# Patient Record
Sex: Female | Born: 1970 | Race: Black or African American | Hispanic: No | Marital: Married | Smoking: Never smoker
Health system: Southern US, Community
[De-identification: ages and names within clinical notes are randomized; demographics above are authoritative.]

## PROBLEM LIST (undated history)

## (undated) DIAGNOSIS — M5416 Radiculopathy, lumbar region: Secondary | ICD-10-CM

## (undated) DIAGNOSIS — Z91148 Patient's other noncompliance with medication regimen for other reason: Secondary | ICD-10-CM

## (undated) DIAGNOSIS — I10 Essential (primary) hypertension: Secondary | ICD-10-CM

## (undated) DIAGNOSIS — D649 Anemia, unspecified: Secondary | ICD-10-CM

## (undated) DIAGNOSIS — Z9114 Patient's other noncompliance with medication regimen: Secondary | ICD-10-CM

## (undated) DIAGNOSIS — M549 Dorsalgia, unspecified: Secondary | ICD-10-CM

## (undated) HISTORY — PX: BREAST SURGERY: SHX581

## (undated) HISTORY — PX: TUBAL LIGATION: SHX77

## (undated) HISTORY — PX: BACK SURGERY: SHX140

---

## 2001-02-09 ENCOUNTER — Other Ambulatory Visit: Admission: RE | Admit: 2001-02-09 | Discharge: 2001-02-09 | Payer: Self-pay | Admitting: Internal Medicine

## 2001-05-28 ENCOUNTER — Encounter: Payer: Self-pay | Admitting: Emergency Medicine

## 2001-05-28 ENCOUNTER — Emergency Department (HOSPITAL_COMMUNITY): Admission: EM | Admit: 2001-05-28 | Discharge: 2001-05-28 | Payer: Self-pay | Admitting: Emergency Medicine

## 2003-06-19 ENCOUNTER — Emergency Department (HOSPITAL_COMMUNITY): Admission: EM | Admit: 2003-06-19 | Discharge: 2003-06-20 | Payer: Self-pay | Admitting: Emergency Medicine

## 2003-09-12 ENCOUNTER — Ambulatory Visit (HOSPITAL_COMMUNITY): Admission: RE | Admit: 2003-09-12 | Discharge: 2003-09-12 | Payer: Self-pay | Admitting: Family Medicine

## 2004-03-08 ENCOUNTER — Emergency Department (HOSPITAL_COMMUNITY): Admission: EM | Admit: 2004-03-08 | Discharge: 2004-03-08 | Payer: Self-pay | Admitting: Emergency Medicine

## 2004-03-17 ENCOUNTER — Emergency Department (HOSPITAL_COMMUNITY): Admission: EM | Admit: 2004-03-17 | Discharge: 2004-03-17 | Payer: Self-pay | Admitting: Emergency Medicine

## 2004-04-05 ENCOUNTER — Emergency Department (HOSPITAL_COMMUNITY): Admission: EM | Admit: 2004-04-05 | Discharge: 2004-04-05 | Payer: Self-pay | Admitting: Emergency Medicine

## 2004-07-18 ENCOUNTER — Emergency Department (HOSPITAL_COMMUNITY): Admission: EM | Admit: 2004-07-18 | Discharge: 2004-07-18 | Payer: Self-pay | Admitting: Emergency Medicine

## 2006-06-09 ENCOUNTER — Emergency Department (HOSPITAL_COMMUNITY): Admission: EM | Admit: 2006-06-09 | Discharge: 2006-06-09 | Payer: Self-pay | Admitting: Emergency Medicine

## 2006-12-07 ENCOUNTER — Emergency Department (HOSPITAL_COMMUNITY): Admission: EM | Admit: 2006-12-07 | Discharge: 2006-12-07 | Payer: Self-pay | Admitting: Emergency Medicine

## 2007-01-20 ENCOUNTER — Emergency Department (HOSPITAL_COMMUNITY): Admission: EM | Admit: 2007-01-20 | Discharge: 2007-01-20 | Payer: Self-pay | Admitting: Emergency Medicine

## 2007-04-04 ENCOUNTER — Emergency Department (HOSPITAL_COMMUNITY): Admission: EM | Admit: 2007-04-04 | Discharge: 2007-04-04 | Payer: Self-pay | Admitting: Emergency Medicine

## 2008-10-26 ENCOUNTER — Emergency Department (HOSPITAL_COMMUNITY): Admission: EM | Admit: 2008-10-26 | Discharge: 2008-10-26 | Payer: Self-pay | Admitting: Emergency Medicine

## 2008-12-04 ENCOUNTER — Emergency Department (HOSPITAL_COMMUNITY): Admission: EM | Admit: 2008-12-04 | Discharge: 2008-12-04 | Payer: Self-pay | Admitting: Emergency Medicine

## 2008-12-12 ENCOUNTER — Emergency Department (HOSPITAL_COMMUNITY): Admission: EM | Admit: 2008-12-12 | Discharge: 2008-12-12 | Payer: Self-pay | Admitting: Emergency Medicine

## 2008-12-13 ENCOUNTER — Emergency Department (HOSPITAL_COMMUNITY): Admission: EM | Admit: 2008-12-13 | Discharge: 2008-12-13 | Payer: Self-pay | Admitting: Emergency Medicine

## 2009-01-03 ENCOUNTER — Encounter
Admission: RE | Admit: 2009-01-03 | Discharge: 2009-01-08 | Payer: Self-pay | Admitting: Physical Medicine & Rehabilitation

## 2009-01-08 ENCOUNTER — Ambulatory Visit: Payer: Self-pay | Admitting: Physical Medicine & Rehabilitation

## 2009-02-25 ENCOUNTER — Emergency Department (HOSPITAL_COMMUNITY): Admission: EM | Admit: 2009-02-25 | Discharge: 2009-02-25 | Payer: Self-pay | Admitting: Emergency Medicine

## 2010-09-08 ENCOUNTER — Encounter: Payer: Self-pay | Admitting: Family Medicine

## 2010-12-18 ENCOUNTER — Emergency Department (HOSPITAL_COMMUNITY)
Admission: EM | Admit: 2010-12-18 | Discharge: 2010-12-18 | Disposition: A | Discharge status: Home / Self Care | Payer: Medicaid Other | Attending: Emergency Medicine | Admitting: Emergency Medicine

## 2010-12-18 ENCOUNTER — Emergency Department (HOSPITAL_COMMUNITY): Payer: Medicaid Other

## 2010-12-18 DIAGNOSIS — M545 Low back pain, unspecified: Secondary | ICD-10-CM | POA: Insufficient documentation

## 2010-12-18 DIAGNOSIS — J309 Allergic rhinitis, unspecified: Secondary | ICD-10-CM | POA: Insufficient documentation

## 2010-12-18 DIAGNOSIS — W010XXA Fall on same level from slipping, tripping and stumbling without subsequent striking against object, initial encounter: Secondary | ICD-10-CM | POA: Insufficient documentation

## 2010-12-18 DIAGNOSIS — I10 Essential (primary) hypertension: Secondary | ICD-10-CM | POA: Insufficient documentation

## 2010-12-18 DIAGNOSIS — S93409A Sprain of unspecified ligament of unspecified ankle, initial encounter: Secondary | ICD-10-CM | POA: Insufficient documentation

## 2010-12-18 DIAGNOSIS — S335XXA Sprain of ligaments of lumbar spine, initial encounter: Secondary | ICD-10-CM | POA: Insufficient documentation
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
## 2011-01-27 ENCOUNTER — Emergency Department (HOSPITAL_COMMUNITY)
Admission: EM | Admit: 2011-01-27 | Discharge: 2011-01-27 | Disposition: A | Discharge status: Home / Self Care | Payer: Medicaid Other | Attending: Emergency Medicine | Admitting: Emergency Medicine

## 2011-01-27 DIAGNOSIS — IMO0002 Reserved for concepts with insufficient information to code with codable children: Secondary | ICD-10-CM | POA: Insufficient documentation

## 2011-01-27 DIAGNOSIS — M549 Dorsalgia, unspecified: Secondary | ICD-10-CM | POA: Insufficient documentation

## 2011-01-27 DIAGNOSIS — X58XXXA Exposure to other specified factors, initial encounter: Secondary | ICD-10-CM | POA: Insufficient documentation

## 2011-01-27 DIAGNOSIS — I10 Essential (primary) hypertension: Secondary | ICD-10-CM | POA: Insufficient documentation

## 2011-03-16 ENCOUNTER — Emergency Department (HOSPITAL_COMMUNITY)
Admission: EM | Admit: 2011-03-16 | Discharge: 2011-03-16 | Disposition: A | Discharge status: Home / Self Care | Payer: Medicaid Other | Attending: Emergency Medicine | Admitting: Emergency Medicine

## 2011-03-16 DIAGNOSIS — M79609 Pain in unspecified limb: Secondary | ICD-10-CM | POA: Insufficient documentation

## 2011-03-16 DIAGNOSIS — M549 Dorsalgia, unspecified: Secondary | ICD-10-CM | POA: Insufficient documentation

## 2011-03-16 DIAGNOSIS — M62838 Other muscle spasm: Secondary | ICD-10-CM

## 2011-03-16 HISTORY — DX: Essential (primary) hypertension: I10

## 2011-03-16 LAB — BASIC METABOLIC PANEL
BUN: 7 mg/dL (ref 6–23)
CO2: 22 mEq/L (ref 19–32)
Chloride: 100 mEq/L (ref 96–112)
Creatinine, Ser: 0.83 mg/dL (ref 0.50–1.10)
GFR calc Af Amer: >60 mL/min (ref >60)
Potassium: 3.5 mEq/L (ref 3.5–5.1)

## 2011-03-16 LAB — MAGNESIUM: Magnesium: 1.8 mg/dL (ref 1.5–2.5)

## 2011-03-16 MED ORDER — HYDROMORPHONE HCL 2 MG/ML IJ SOLN
2.0000 mg | Freq: Once | INTRAMUSCULAR | Status: AC
  Administered 2011-03-16: 2 mg via INTRAMUSCULAR
  Filled 2011-03-16: qty 1

## 2011-03-16 MED ORDER — ONDANSETRON 4 MG PO TBDP
ORAL_TABLET | ORAL | Status: AC
  Filled 2011-03-16: qty 1

## 2011-03-16 MED ORDER — ONDANSETRON 4 MG PO TBDP
4.0000 mg | ORAL_TABLET | Freq: Once | ORAL | Status: AC
  Administered 2011-03-16: 4 mg via ORAL
  Filled 2011-03-16: qty 1

## 2011-03-16 MED ORDER — DIAZEPAM 5 MG PO TABS
5.0000 mg | ORAL_TABLET | Freq: Once | ORAL | Status: AC
  Administered 2011-03-16: 5 mg via ORAL
  Filled 2011-03-16: qty 1

## 2011-03-16 MED ORDER — CYCLOBENZAPRINE HCL 10 MG PO TABS: 10.0000 mg | ORAL_TABLET | Freq: Two times a day (BID) | ORAL | Status: AC | PRN

## 2011-03-16 MED ORDER — NAPROXEN 500 MG PO TABS: 500.0000 mg | ORAL_TABLET | Freq: Two times a day (BID) | ORAL | Status: DC

## 2011-03-16 MED ORDER — OXYCODONE-ACETAMINOPHEN 5-325 MG PO TABS: 2.0000 | ORAL_TABLET | ORAL | Status: AC | PRN

## 2011-03-16 MED ORDER — NAPROXEN 250 MG PO TABS
500.0000 mg | ORAL_TABLET | Freq: Once | ORAL | Status: AC
  Administered 2011-03-16: 500 mg via ORAL
  Filled 2011-03-16: qty 2
  Filled 2011-03-16: qty 1
  Filled 2011-03-16: qty 2

## 2011-03-16 NOTE — ED Notes (Signed)
Pt still rates pain as a 6, pt is sleeping, will arouse with verbal stimuli,

## 2011-03-16 NOTE — ED Notes (Signed)
Pt c/o cramping to right upper leg upon standing up at discharge, heat bags applied to right leg per pt request, states that the heat makes the cramp better, pt assisted to wheelchair, family member at bedside,

## 2011-03-16 NOTE — ED Notes (Signed)
Pt states that the heat is making the cramp better,

## 2011-03-16 NOTE — ED Notes (Signed)
Pt up walking in room,

## 2011-03-16 NOTE — ED Notes (Signed)
Tried to walk pt, pt said she was in to much pain, she was having another cramp, let the RN know

## 2011-03-16 NOTE — ED Notes (Signed)
Pt reports woke up this am with severe muscle cramps in both legs.  Says has had this happen before but say doesn't know what caused it.

## 2011-03-16 NOTE — ED Provider Notes (Signed)
Scribed for Dr. Brooke Dare, the patient was seen in room 1. This chart was scribed by Jannette Fogo. This patient's care was started at 10:26.   Chief Complaint  Patient presents with  . Leg Pain    muscle cramps    HPI Mariah Holloway is a 40 y.o. female with a history of lumbar post laminectomy syndrome with chronic radicular pain, left S1 dermatome, who presents to the Emergency Department complaining of bilateral lower extremity muscle cramping/spasms and chronic back pain. Patient states she developed the muscle cramps/spasms on her thighs which radiated to the back and migrated down the legs. Pain is exacerbated by movement and today while trying to ambulate she had difficulty due to pain then developed dizziness. Currently patient complains of ankle spasms. She denies any associated bowel/urinary incontinence or problems urinating. She reports a history of similar symptoms in the past but not this severe. Has taken Hydrocodone but reports no relief. There are no other associated symptoms and no other alleviating or aggravating factors.     Past Medical History  Diagnosis Date  . Hypertension   Lumbar post laminectomy syndrome with chronic radicular pain, left S1 dermatome.   Past Surgical History  Procedure Date  . Back surgery   . Breast surgery      MEDICATIONS:  Previous Medications   AMLODIPINE-BENAZEPRIL (LOTREL) 5-20 MG PER CAPSULE    Take 1 capsule by mouth every morning.     CLONAZEPAM (KLONOPIN) 1 MG TABLET    Take 1 mg by mouth 3 (three) times daily.     ESCITALOPRAM (LEXAPRO) 20 MG TABLET    Take 20 mg by mouth every morning.     NIACIN 500 MG TABLET    Take 500 mg by mouth daily.     OMEPRAZOLE (PRILOSEC) 20 MG CAPSULE    Take 20 mg by mouth every morning.     TRAZODONE (DESYREL) 50 MG TABLET    Take 50 mg by mouth at bedtime.       ALLERGIES:  Allergies as of 03/16/2011 - Review Complete 03/16/2011  Allergen Reaction Noted  . Sulfa antibiotics Hives and Swelling  03/16/2011     Family History  Problem Relation Age of Onset  . Diabetes Mother   . Hypertension Father     History  Substance Use Topics  . Smoking status: Never Smoker   . Smokeless tobacco: Not on file  . Alcohol Use: No  Accompanied to the ED by family    Review of Systems  Constitutional: Negative.   HENT: Negative.   Respiratory: Negative.   Cardiovascular: Negative.   Gastrointestinal: Negative.   Genitourinary: Negative for dysuria, frequency and difficulty urinating.  Musculoskeletal: Positive for back pain. Negative for joint swelling.  Skin: Negative.   Neurological: Negative.   Hematological: Negative.     Physical Exam  BP 121/94  Pulse 93  Temp 97.6 F (36.4 C)  Resp 18  Ht 5\' 4"  (1.626 m)  Wt 250 lb (113.399 kg)  BMI 42.91 kg/m2  SpO2 98%  LMP 03/02/2011   Physical Exam  Constitutional: She is oriented to person, place, and time. She appears well-developed and well-nourished.       Tearful. Uncomfortable appearing.   HENT:  Head: Normocephalic and atraumatic.  Mouth/Throat: Oropharynx is clear and moist.  Eyes: Conjunctivae are normal. Pupils are equal, round, and reactive to light.  Neck: Normal range of motion. Neck supple.  Cardiovascular: Normal rate, regular rhythm and normal heart sounds.  Pulmonary/Chest: Effort normal and breath sounds normal.  Abdominal: Soft. She exhibits no distension.  Musculoskeletal: Normal range of motion. She exhibits no edema and no tenderness.       Lumbar back: She exhibits tenderness (bilateral lower lateral musculature tenderness. No midline tenderness. ). She exhibits no swelling and no edema.       Palpable spasm in legs.   Neurological: She is alert and oriented to person, place, and time. Coordination normal.  Skin: Skin is warm and dry. No rash noted.    OTHER DATA REVIEWED: Nursing notes, vital signs, and past medical records reviewed.   DIAGNOSTIC STUDIES: Oxygen Saturation is 98% on room  air, normal by my interpretation.     LABS / RADIOLOGY:  Results for orders placed during the hospital encounter of 03/16/11  BASIC METABOLIC PANEL      Component Value Range   Sodium 138  135 - 145 (mEq/L)   Potassium 3.5  3.5 - 5.1 (mEq/L)   Chloride 100  96 - 112 (mEq/L)   CO2 22  19 - 32 (mEq/L)   Glucose, Bld 106 (*) 70 - 99 (mg/dL)   BUN 7  6 - 23 (mg/dL)   Creatinine, Ser 4.09  0.50 - 1.10 (mg/dL)   Calcium 9.1  8.4 - 81.1 (mg/dL)   GFR calc non Af Amer >60  >60 (mL/min)   GFR calc Af Amer >60  >60 (mL/min)  MAGNESIUM      Component Value Range   Magnesium 1.8  1.5 - 2.5 (mg/dL)     ED COURSE / COORDINATION OF CARE: 11:01 - Dilaudid, Valium, and Zofran given.  11:45 - Re-examined by ED physician, patient screams when back is palpated.  12:02 - Re-examined, patient ready for gait test.  12:15 - Patient ambulatory with steady gait while in the ED.  12:50 - Patient stable for discharge.    MDM: Differential Diagnosis: Muscle spasm versus strain His chemistry was performed to rule out partial abnormalities as a cause for cramping. This received Dilaudid, Valium, Naprosyn for symptom control. She had improvement after taking the medications. I have no concern for a malignant cause of back pain such as cauda equina as the patient has no loss of bowel or bladder function, weakness, paresthesias. There was palpable spasm on examination of the back as well as legs. The patient ambulated around the department without difficulty. She'll be discharged home with pain control, muscle relaxers, anti-inflammatories and instructions to followup with her primary care physician in one week. She provided signs and symptoms of which returned emergency dept  I personally performed the services described in this documentation, which was scribed in my presence. The recorded information has been reviewed and considered.    IMPRESSION: Diagnoses that have been ruled out:  Diagnoses that are  still under consideration:  Final diagnoses:  Spasm of muscle     PLAN: Discharged  The patient is to return the emergency department if there is any worsening of symptoms. I have reviewed the discharge instructions with the patient.    CONDITION ON DISCHARGE: Stable     MEDICATIONS GIVEN IN THE E.D.  Medications  traZODone (DESYREL) 50 MG tablet (not administered)  escitalopram (LEXAPRO) 20 MG tablet (not administered)  omeprazole (PRILOSEC) 20 MG capsule (not administered)  amLODipine-benazepril (LOTREL) 5-20 MG per capsule (not administered)  clonazePAM (KLONOPIN) 1 MG tablet (not administered)  niacin 500 MG tablet (not administered)  HYDROmorphone (DILAUDID) injection 2 mg (2 mg Intramuscular Given 03/16/11 1101)  naproxen (NAPROSYN) tablet 500 mg (500 mg Oral Given 03/16/11 1107)  diazepam (VALIUM) tablet 5 mg (5 mg Oral Given 03/16/11 1059)  ondansetron (ZOFRAN-ODT) disintegrating tablet 4 mg (4 mg Oral Given 03/16/11 1158)     DISCHARGE MEDICATIONS: New Prescriptions   No medications on file     Procedures        Dayton Bailiff, MD 03/16/11 1250

## 2011-03-27 ENCOUNTER — Emergency Department (HOSPITAL_COMMUNITY)
Admission: EM | Admit: 2011-03-27 | Discharge: 2011-03-27 | Disposition: A | Discharge status: Home / Self Care | Payer: Medicaid Other | Attending: Emergency Medicine | Admitting: Emergency Medicine

## 2011-03-27 ENCOUNTER — Encounter (HOSPITAL_COMMUNITY): Payer: Self-pay | Admitting: *Deleted

## 2011-03-27 DIAGNOSIS — L03119 Cellulitis of unspecified part of limb: Secondary | ICD-10-CM | POA: Insufficient documentation

## 2011-03-27 DIAGNOSIS — L02416 Cutaneous abscess of left lower limb: Secondary | ICD-10-CM

## 2011-03-27 DIAGNOSIS — L02419 Cutaneous abscess of limb, unspecified: Secondary | ICD-10-CM | POA: Insufficient documentation

## 2011-03-27 DIAGNOSIS — I10 Essential (primary) hypertension: Secondary | ICD-10-CM | POA: Insufficient documentation

## 2011-03-27 MED ORDER — LIDOCAINE-EPINEPHRINE (PF) 2 %-1:200000 IJ SOLN
INTRAMUSCULAR | Status: AC
  Filled 2011-03-27: qty 20

## 2011-03-27 MED ORDER — LIDOCAINE-EPINEPHRINE 2 %-1:100000 IJ SOLN
20.0000 mL | Freq: Once | INTRAMUSCULAR | Status: AC
  Administered 2011-03-27: 20 mL

## 2011-03-27 MED ORDER — DOXYCYCLINE HYCLATE 100 MG PO CAPS: 100.0000 mg | ORAL_CAPSULE | Freq: Two times a day (BID) | ORAL | Status: DC

## 2011-03-27 NOTE — ED Provider Notes (Signed)
History     CSN: [id number] Arrival date & time: 03/27/2011 10:38 AM  Chief Complaint  Patient presents with  . Abscess   Patient is a 40 y.o. female presenting with abscess. The history is provided by the patient.  Abscess  This is a new problem. The current episode started yesterday. The onset was gradual. The problem occurs continuously. The problem has been unchanged. Affected Location: left thigh. The problem is mild. The abscess is characterized by draining. Pertinent negatives include no fever and no vomiting. Her past medical history is significant for skin abscesses in family. There were no sick contacts.    Past Medical History  Diagnosis Date  . Hypertension     Past Surgical History  Procedure Date  . Back surgery   . Breast surgery     Family History  Problem Relation Age of Onset  . Diabetes Mother   . Hypertension Father     History  Substance Use Topics  . Smoking status: Never Smoker   . Smokeless tobacco: Not on file  . Alcohol Use: No    OB History    Grav Para Term Preterm Abortions TAB SAB Ect Mult Living                  Review of Systems  Constitutional: Negative for fever.  Gastrointestinal: Negative for vomiting.  All other systems reviewed and are negative.    Physical Exam  BP 131/82  Pulse 86  Temp(Src) 98.7 F (37.1 C) (Oral)  Resp 18  Ht 5\' 4"  (1.626 m)  Wt 250 lb (113.399 kg)  BMI 42.91 kg/m2  SpO2 100%  LMP 03/02/2011  Physical Exam  Constitutional: She is oriented to person, place, and time. She appears well-developed and well-nourished.  HENT:  Head: Normocephalic.  Eyes: EOM are normal.  Neck: Normal range of motion.  Pulmonary/Chest: Effort normal.  Musculoskeletal: Normal range of motion.       Left inner thigh with small fluctuant abscess without erythema or surrounding warmth. No drainage noted  Neurological: She is alert and oriented to person, place, and time.  Psychiatric: She has a normal mood and  affect.    ED Course  INCISION AND DRAINAGE Performed by: Lyanne Co Authorized by: Lyanne Co Consent: Verbal consent obtained. Risks and benefits: risks, benefits and alternatives were discussed Consent given by: patient Patient identity confirmed: verbally with patient Type: abscess Body area: lower extremity Location details: left leg Anesthesia: local infiltration Local anesthetic: lidocaine 2% with epinephrine Patient sedated: no Scalpel size: 11 Incision type: single straight Drainage amount: scant Packing material: 1/4 in gauze Patient tolerance: Patient tolerated the procedure well with no immediate complications.    MDM Small focal abscess of left proximal medial thigh. I and D performed. Placed on doxy. Home with NSAIDs      Lyanne Co, MD 03/27/11 2207

## 2011-03-27 NOTE — ED Notes (Signed)
Pt c/o painful area to left inner thigh. Pt states it started as small knot yesterday she try to drain and it became worse today.

## 2011-04-07 ENCOUNTER — Emergency Department (HOSPITAL_COMMUNITY)
Admission: EM | Admit: 2011-04-07 | Discharge: 2011-04-07 | Disposition: A | Discharge status: Home / Self Care | Payer: Medicaid Other | Attending: Emergency Medicine | Admitting: Emergency Medicine

## 2011-04-07 ENCOUNTER — Emergency Department (HOSPITAL_COMMUNITY): Payer: Medicaid Other

## 2011-04-07 ENCOUNTER — Encounter (HOSPITAL_COMMUNITY): Payer: Self-pay | Admitting: *Deleted

## 2011-04-07 DIAGNOSIS — M545 Low back pain, unspecified: Secondary | ICD-10-CM | POA: Insufficient documentation

## 2011-04-07 DIAGNOSIS — Y92009 Unspecified place in unspecified non-institutional (private) residence as the place of occurrence of the external cause: Secondary | ICD-10-CM | POA: Insufficient documentation

## 2011-04-07 DIAGNOSIS — W108XXA Fall (on) (from) other stairs and steps, initial encounter: Secondary | ICD-10-CM | POA: Insufficient documentation

## 2011-04-07 DIAGNOSIS — M7918 Myalgia, other site: Secondary | ICD-10-CM

## 2011-04-07 MED ORDER — HYDROCODONE-ACETAMINOPHEN 5-325 MG PO TABS: 2.0000 | ORAL_TABLET | ORAL | Status: AC | PRN

## 2011-04-07 MED ORDER — MORPHINE SULFATE 4 MG/ML IJ SOLN
4.0000 mg | Freq: Once | INTRAMUSCULAR | Status: AC
  Administered 2011-04-07: 4 mg via INTRAMUSCULAR
  Filled 2011-04-07: qty 1

## 2011-04-07 MED ORDER — IBUPROFEN 600 MG PO TABS: 600.0000 mg | ORAL_TABLET | Freq: Four times a day (QID) | ORAL | Status: AC | PRN

## 2011-04-07 NOTE — ED Provider Notes (Signed)
History    Scribed for Forbes Cellar, MD, the patient was seen in room APA15/APA15. This chart was scribed by Clarita Crane. This patient's care was started at 9:52AM.  CSN: [id number] Arrival date & time: 04/07/2011  9:46 AM  Chief Complaint  Patient presents with  . Back Pain   HPI Anyiah A Andrey Campanile is a 40 y.o. female who presents to the Emergency Department complaining of constant diffuse lower back pain onset yesterday after she fell on her back down a flight of stairs and worse this morning after an additional fall down a set of stairs on her back. Patient reports she fell yesterday after attempting to move something (has sheet hanging on steps to keep air conditioning from going downstairs) prior to walking down the stairs and tripped causing her to fall on her buttocks first and then proceeded to fall down the remaining steps on her back. Patient notes taking Flexeril (for chronic back spasms secondary to spinal fusion performed in 1998) prior to fall yesterday. Patient reports similar mechanism of fall this morning. Reports back pain is not relieved with use of Ibuprofen and is aggravated with movement and deep breathing. Notes she has been able to ambulate despite pain. Denies head injury, LOC, incontinence, chest pain, SOB, new numbness/tingling/weakness LE. Patient with h/o hypertension. She has had previous L5/6 fusion for herniated disc  HPI ELEMENTS: Location: diffuse lower back Onset: yesterday but worse this morning Duration: persistent since onset Timing: constant   Modifying factors: aggravated by movement and deep breathing  Context:  as above  Associated symptoms:   Denies head injury, LOC, incontinence, chest pain, SOB, numbness/tingling.   PAST MEDICAL HISTORY:  Past Medical History  Diagnosis Date  . Hypertension     PAST SURGICAL HISTORY:  Past Surgical History  Procedure Date  . Back surgery   . Breast surgery   . Tubal ligation     MEDICATIONS:  Previous  Medications   AMLODIPINE-BENAZEPRIL (LOTREL) 5-20 MG PER CAPSULE    Take 1 capsule by mouth every morning.     CHOLECALCIFEROL (VITAMIN D PO)    Take 1 tablet by mouth daily. Unknown strength. 1 chewable tablet daily    CLONAZEPAM (KLONOPIN) 1 MG TABLET    Take 1 mg by mouth 3 (three) times daily.     ESCITALOPRAM (LEXAPRO) 20 MG TABLET    Take 20 mg by mouth every morning.     NIACIN 500 MG TABLET    Take 500 mg by mouth daily.     OMEPRAZOLE (PRILOSEC) 20 MG CAPSULE    Take 20 mg by mouth every morning.     TRAZODONE (DESYREL) 50 MG TABLET    Take 50 mg by mouth at bedtime.       ALLERGIES:  Allergies as of 04/07/2011 - Review Complete 04/07/2011  Allergen Reaction Noted  . Sulfa antibiotics Hives and Swelling 03/16/2011     FAMILY HISTORY:  Family History  Problem Relation Age of Onset  . Diabetes Mother   . Hypertension Father      SOCIAL HISTORY: History   Social History  . Marital Status: Legally Separated    Spouse Name: N/A    Number of Children: N/A  . Years of Education: N/A   Social History Main Topics  . Smoking status: Never Smoker   . Smokeless tobacco: None  . Alcohol Use: No  . Drug Use: No  . Sexually Active:    Other Topics Concern  . None  Social History Narrative  . None     Review of Systems 10 Systems reviewed and are negative for acute change except as noted in the HPI.  Physical Exam  BP 152/117  Pulse 82  Temp(Src) 98.3 F (36.8 C) (Oral)  Resp 20  Ht 5\' 4"  (1.626 m)  Wt 250 lb (113.399 kg)  BMI 42.91 kg/m2  SpO2 100%  LMP 04/04/2011  Physical Exam  Nursing note and vitals reviewed. Constitutional: She is oriented to person, place, and time. She appears well-developed and well-nourished. No distress.  HENT:  Head: Normocephalic and atraumatic.  Mouth/Throat: Oropharynx is clear and moist.  Eyes: EOM are normal. Pupils are equal, round, and reactive to light.  Neck: Normal range of motion. Neck supple.  Cardiovascular:  Normal rate and regular rhythm.  Exam reveals no gallop and no friction rub.   No murmur heard. Pulmonary/Chest: Effort normal and breath sounds normal. She has no wheezes.  Abdominal: Soft. Bowel sounds are normal. She exhibits no distension. There is no tenderness.  Musculoskeletal: Normal range of motion. She exhibits no edema.       No tenderness to C?T spine. Diffuse paraspinal lumbar tenderness and midline lumbar tenderness. No step-offs or, ecchymosis noted.   Neurological: She is alert and oriented to person, place, and time. She has normal strength. No sensory deficit.       No saddle anesthesia.   Skin: Skin is warm and dry.       No bruising or ecchymosis noted. No saddle anesthesia  Psychiatric: She has a normal mood and affect. Her behavior is normal.  strength 5/5 b/l LE  ED Course  Procedures  OTHER DATA REVIEWED: Nursing notes, vital signs, and past medical records reviewed. Lab results reviewed and considered Imaging results reviewed and considered  DIAGNOSTIC STUDIES: Oxygen Saturation is 100% on room air, normal by my interpretation.    LABS / RADIOLOGY: Results for orders placed during the hospital encounter of 03/16/11  BASIC METABOLIC PANEL      Component Value Range   Sodium 138  135 - 145 (mEq/L)   Potassium 3.5  3.5 - 5.1 (mEq/L)   Chloride 100  96 - 112 (mEq/L)   CO2 22  19 - 32 (mEq/L)   Glucose, Bld 106 (*) 70 - 99 (mg/dL)   BUN 7  6 - 23 (mg/dL)   Creatinine, Ser 1.61  0.50 - 1.10 (mg/dL)   Calcium 9.1  8.4 - 09.6 (mg/dL)   GFR calc non Af Amer >60  >60 (mL/min)   GFR calc Af Amer >60  >60 (mL/min)  MAGNESIUM      Component Value Range   Magnesium 1.8  1.5 - 2.5 (mg/dL)   Dg Lumbar Spine Complete  04/07/2011  *RADIOLOGY REPORT*  Clinical Data: Pain, fall  LUMBAR SPINE - COMPLETE 4+ VIEW  Comparison: 12/18/2010  Findings: Five non-rib bearing lumbar vertebrae. Osseous mineralization grossly normal. Disc space narrowing with vacuum phenomenon  L5-S1. Remaining vertebral body and disc space heights maintained. No acute fracture, subluxation or bone destruction. No spondylolysis. SI joints symmetric. Small left pelvic phlebolith stable.  IMPRESSION: Degenerative disc disease changes L5-S1. No acute abnormalities.  Original Report Authenticated By: Lollie Marrow, M.D.    PROCEDURES:  ED COURSE / COORDINATION OF CARE: Orders Placed This Encounter  Procedures  . DG Lumbar Spine Complete  9:58AM- Patient ambulatory to restroom.    MDM: Suspect msk back pain. Neurovasc intact. Analgesia here. XR negative. D/C home-- continue to  take her home flexeril and will add ibuprofen/norco to that   PLAN: D/C home with PMD and/or ortho f/u The patient is to return the emergency department if there is any worsening of symptoms. I have reviewed the discharge instructions with the patient/family  CONDITION ON DISCHARGE: Stable   MEDICATIONS GIVEN IN THE E.D.  Medications  Cholecalciferol (VITAMIN D PO) (not administered)  naproxen (NAPROSYN) 500 MG tablet (not administered)  doxycycline (VIBRAMYCIN) 100 MG capsule (not administered)  morphine injection 4 mg (4 mg Intramuscular Given 04/07/11 1015)     I personally performed the services described in this documentation, which was scribed in my presence. The recorded information has been reviewed and considered. Forbes Cellar, MD  Stefano Gaul, MD        Forbes Cellar, MD 04/07/11 [phone]

## 2011-04-07 NOTE — ED Notes (Signed)
States fell down steps yesterday after missing top step and sliding down the stairs.  Larey Seat again this morning doing the same thing.  C/o lower back pain and tailbone pain.  Denies numbness/tingling.  Denies hitting head/LOC during both falls.

## 2011-04-22 ENCOUNTER — Emergency Department (HOSPITAL_COMMUNITY)
Admission: EM | Admit: 2011-04-22 | Discharge: 2011-04-22 | Disposition: A | Discharge status: Home / Self Care | Payer: Medicaid Other | Attending: Emergency Medicine | Admitting: Emergency Medicine

## 2011-04-22 ENCOUNTER — Emergency Department (HOSPITAL_COMMUNITY): Payer: Medicaid Other

## 2011-04-22 ENCOUNTER — Encounter (HOSPITAL_COMMUNITY): Payer: Self-pay | Admitting: Emergency Medicine

## 2011-04-22 DIAGNOSIS — IMO0002 Reserved for concepts with insufficient information to code with codable children: Secondary | ICD-10-CM | POA: Insufficient documentation

## 2011-04-22 DIAGNOSIS — S7000XA Contusion of unspecified hip, initial encounter: Secondary | ICD-10-CM | POA: Insufficient documentation

## 2011-04-22 DIAGNOSIS — S20229A Contusion of unspecified back wall of thorax, initial encounter: Secondary | ICD-10-CM | POA: Insufficient documentation

## 2011-04-22 DIAGNOSIS — S0003XA Contusion of scalp, initial encounter: Secondary | ICD-10-CM | POA: Insufficient documentation

## 2011-04-22 DIAGNOSIS — I10 Essential (primary) hypertension: Secondary | ICD-10-CM | POA: Insufficient documentation

## 2011-04-22 DIAGNOSIS — T7411XA Adult physical abuse, confirmed, initial encounter: Secondary | ICD-10-CM | POA: Insufficient documentation

## 2011-04-22 DIAGNOSIS — S0990XA Unspecified injury of head, initial encounter: Secondary | ICD-10-CM

## 2011-04-22 MED ORDER — OXYCODONE-ACETAMINOPHEN 5-325 MG PO TABS
1.0000 | ORAL_TABLET | Freq: Once | ORAL | Status: AC
  Administered 2011-04-22: 1 via ORAL
  Filled 2011-04-22: qty 1
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
## 2011-04-22 NOTE — ED Notes (Signed)
Patient with no complaints at this time. Respirations even and unlabored. Skin warm/dry. Discharge instructions reviewed with patient at this time. Patient given opportunity to voice concerns/ask questions. Patient discharged at this time and left Emergency Department with steady gait.   
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
## 2014-05-15 ENCOUNTER — Emergency Department (HOSPITAL_COMMUNITY)
Admission: EM | Admit: 2014-05-15 | Discharge: 2014-05-15 | Disposition: A | Discharge status: Home / Self Care | Payer: Medicaid Other | Attending: Emergency Medicine | Admitting: Emergency Medicine

## 2014-05-15 ENCOUNTER — Encounter (HOSPITAL_COMMUNITY): Payer: Self-pay | Admitting: Emergency Medicine

## 2014-05-15 ENCOUNTER — Emergency Department (HOSPITAL_COMMUNITY): Payer: Medicaid Other

## 2014-05-15 DIAGNOSIS — Z79899 Other long term (current) drug therapy: Secondary | ICD-10-CM | POA: Insufficient documentation

## 2014-05-15 DIAGNOSIS — R0789 Other chest pain: Secondary | ICD-10-CM | POA: Insufficient documentation

## 2014-05-15 DIAGNOSIS — R42 Dizziness and giddiness: Secondary | ICD-10-CM | POA: Insufficient documentation

## 2014-05-15 DIAGNOSIS — I10 Essential (primary) hypertension: Secondary | ICD-10-CM | POA: Insufficient documentation

## 2014-05-15 DIAGNOSIS — R110 Nausea: Secondary | ICD-10-CM | POA: Insufficient documentation

## 2014-05-15 DIAGNOSIS — R61 Generalized hyperhidrosis: Secondary | ICD-10-CM | POA: Insufficient documentation

## 2014-05-15 DIAGNOSIS — R079 Chest pain, unspecified: Secondary | ICD-10-CM | POA: Insufficient documentation

## 2014-05-15 DIAGNOSIS — F411 Generalized anxiety disorder: Secondary | ICD-10-CM | POA: Insufficient documentation

## 2014-05-15 DIAGNOSIS — R0602 Shortness of breath: Secondary | ICD-10-CM | POA: Insufficient documentation

## 2014-05-15 DIAGNOSIS — IMO0001 Reserved for inherently not codable concepts without codable children: Secondary | ICD-10-CM | POA: Insufficient documentation

## 2014-05-15 LAB — CBC WITH DIFFERENTIAL/PLATELET
BASOS ABS: 0 10*3/uL (ref 0.0–0.1)
Basophils Relative: 1 % (ref 0–1)
EOS PCT: 1 % (ref 0–5)
Eosinophils Absolute: 0 10*3/uL (ref 0.0–0.7)
HEMATOCRIT: 32.1 % — AB (ref 36.0–46.0)
Hemoglobin: 10.5 g/dL — ABNORMAL LOW (ref 12.0–15.0)
LYMPHS PCT: 39 % (ref 12–46)
Lymphs Abs: 1.5 10*3/uL (ref 0.7–4.0)
MCH: 27.1 pg (ref 26.0–34.0)
MCHC: 32.7 g/dL (ref 30.0–36.0)
MCV: 82.7 fL (ref 78.0–100.0)
MONO ABS: 0.3 10*3/uL (ref 0.1–1.0)
Monocytes Relative: 8 % (ref 3–12)
Neutro Abs: 2 10*3/uL (ref 1.7–7.7)
Neutrophils Relative %: 51 % (ref 43–77)
Platelets: 256 10*3/uL (ref 150–400)
RBC: 3.88 MIL/uL (ref 3.87–5.11)
RDW: 15.5 % (ref 11.5–15.5)
WBC: 3.9 10*3/uL — AB (ref 4.0–10.5)

## 2014-05-15 LAB — COMPREHENSIVE METABOLIC PANEL
ALT: 14 U/L (ref 0–35)
ANION GAP: 10 (ref 5–15)
AST: 16 U/L (ref 0–37)
Albumin: 3.3 g/dL — ABNORMAL LOW (ref 3.5–5.2)
Alkaline Phosphatase: 63 U/L (ref 39–117)
BUN: 7 mg/dL (ref 6–23)
CALCIUM: 8.7 mg/dL (ref 8.4–10.5)
CO2: 26 meq/L (ref 19–32)
CREATININE: 0.78 mg/dL (ref 0.50–1.10)
Chloride: 105 mEq/L (ref 96–112)
Glucose, Bld: 128 mg/dL — ABNORMAL HIGH (ref 70–99)
Potassium: 4 mEq/L (ref 3.7–5.3)
Sodium: 141 mEq/L (ref 137–147)
Total Bilirubin: 0.2 mg/dL — ABNORMAL LOW (ref 0.3–1.2)
Total Protein: 7.3 g/dL (ref 6.0–8.3)

## 2014-05-15 LAB — TROPONIN I: Troponin I: <0.3 ng/mL (ref <0.30)

## 2014-05-15 LAB — D-DIMER, QUANTITATIVE (NOT AT ARMC)

## 2014-05-15 MED ORDER — HYDROCODONE-ACETAMINOPHEN 5-325 MG PO TABS
1.0000 | ORAL_TABLET | Freq: Once | ORAL | Status: AC
  Administered 2014-05-15: 1 via ORAL
  Filled 2014-05-15: qty 1

## 2014-05-15 MED ORDER — ASPIRIN 81 MG PO CHEW
324.0000 mg | CHEWABLE_TABLET | Freq: Once | ORAL | Status: AC
  Administered 2014-05-15: 324 mg via ORAL
  Filled 2014-05-15: qty 4

## 2014-05-15 MED ORDER — LORAZEPAM 1 MG PO TABS
1.0000 mg | ORAL_TABLET | Freq: Once | ORAL | Status: AC
  Administered 2014-05-15: 1 mg via ORAL
  Filled 2014-05-15: qty 1

## 2014-05-15 NOTE — ED Provider Notes (Signed)
CSN: [id number]     Arrival date & time 05/15/14  1741 History  This chart was scribed for Mariah Essex, MD by Mariah Holloway, ED Scribe. This patient was seen in room APA07/APA07 and the patient's care was started at 6:14 PM.    Chief Complaint  Patient presents with  . Chest Pain   The history is provided by the patient. No language interpreter was used.    HPI Comments: Mariah Holloway is a 43 y.o. female with history of hypertension who presents to the Emergency Department complaining of intermittent substernal chest tightness with onset 2 hours ago. She states the tightness lasts for a few minutes. She denies particular exertion when symptoms began; she states it began as lightheadedness and malaise followed by chest tightness. She reports associated SOB, nausea, feeling hot and sweaty, and feeling clammy. She states she currently only feels lightheaded when she stands up. She denies having similar symptoms previously except for a few brief episodes when she was arguing with her daughter. She reports a history of depression, insomnia, and panic attacks; she states her panic attacks are unlike current symptoms. She states she has not had a stress test. She denies smoking. She denies abdominal pain, cough, fever, or back pain above baseline.  Past Medical History  Diagnosis Date  . Hypertension    Past Surgical History  Procedure Laterality Date  . Back surgery    . Breast surgery    . Tubal ligation    . Back surgery     Family History  Problem Relation Age of Onset  . Diabetes Mother   . Hypertension Father    History  Substance Use Topics  . Smoking status: Never Smoker   . Smokeless tobacco: Not on file  . Alcohol Use: No   OB History   Grav Para Term Preterm Abortions TAB SAB Ect Mult Living                 Review of Systems  Constitutional: Positive for diaphoresis. Negative for fever.       Malaise  Respiratory: Positive for chest tightness and shortness of  breath. Negative for cough.   Gastrointestinal: Positive for nausea. Negative for abdominal pain.  Musculoskeletal: Negative for back pain.  Neurological: Positive for dizziness and light-headedness.   A complete 10 system review of systems was obtained and all systems are negative except as noted in the HPI and PMH.     Allergies  Sulfa antibiotics  Home Medications   Prior to Admission medications   Medication Sig Start Date End Date Taking? Authorizing Provider  citalopram (CELEXA) 20 MG tablet Take 20 mg by mouth daily.   Yes Historical Provider, MD  traZODone (DESYREL) 100 MG tablet Take 100 mg by mouth at bedtime as needed for sleep.   Yes Historical Provider, MD   BP 143/105  Pulse 65  Temp(Src) 98.3 F (36.8 C) (Oral)  Resp 13  Ht 5' 3.5" (1.613 m)  Wt 260 lb (117.935 kg)  BMI 45.33 kg/m2  SpO2 99%  LMP 05/13/2014 Physical Exam  Nursing note and vitals reviewed. Constitutional: She is oriented to person, place, and time. She appears well-nourished.  HENT:  Head: Normocephalic and atraumatic.  Mouth/Throat: Oropharynx is clear and moist. No oropharyngeal exudate.  Eyes: Conjunctivae and EOM are normal. Pupils are equal, round, and reactive to light.  Neck: Normal range of motion. Neck supple.  No meningismus.  Cardiovascular: Normal rate, regular rhythm, normal heart sounds and intact  distal pulses.   No murmur heard. Pulmonary/Chest: Effort normal and breath sounds normal. No respiratory distress. She exhibits no tenderness.  Abdominal: Soft. There is no tenderness. There is no rebound and no guarding.  Musculoskeletal: Normal range of motion. She exhibits no edema and no tenderness.  Neurological: She is alert and oriented to person, place, and time. No cranial nerve deficit. She exhibits normal muscle tone. Coordination normal.  No ataxia on finger to nose bilaterally. No pronator drift. 5/5 strength throughout. CN 2-12 intact. Negative Romberg. Equal grip  strength. Sensation intact. Gait is normal.   Skin: Skin is warm.  Psychiatric:  anxious    ED Course  Procedures (including critical care time) Labs Review Labs Reviewed  CBC WITH DIFFERENTIAL - Abnormal; Notable for the following:    WBC 3.9 (*)    Hemoglobin 10.5 (*)    HCT 32.1 (*)    All other components within normal limits  COMPREHENSIVE METABOLIC PANEL - Abnormal; Notable for the following:    Glucose, Bld 128 (*)    Albumin 3.3 (*)    Total Bilirubin 0.2 (*)    All other components within normal limits  TROPONIN I  D-DIMER, QUANTITATIVE  TROPONIN I    Imaging Review Dg Chest 2 View  05/15/2014   CLINICAL DATA:  New onset chest pain for 2 hr. Acute chest pain with shortness of breath.  EXAM: CHEST  2 VIEW  COMPARISON:  None.  FINDINGS: Cardiopericardial silhouette within normal limits. Mediastinal contours normal. Trachea midline. No airspace disease or effusion. Monitoring leads project over the chest.  IMPRESSION: No active cardiopulmonary disease.   Electronically Signed   By: Mariah Holloway M.D.   On: 05/15/2014 19:32     EKG Interpretation   Date/Time:  Monday May 15 2014 18:03:57 EDT Ventricular Rate:  76 PR Interval:  132 QRS Duration: 84 QT Interval:  402 QTC Calculation: 452 R Axis:   40 Text Interpretation:  Normal sinus rhythm Normal ECG No previous ECGs  available Confirmed by Mariah Schlechter  MD, Mariah Holloway [phone]) on 05/15/2014 6:11:54  PM     DIAGNOSTIC STUDIES: Oxygen Saturation is 100% on room air, normal by my interpretation.    COORDINATION OF CARE: 6:20 PM Discussed treatment plan with patient at beside, the patient agrees with the plan and has no further questions at this time.  MDM   Final diagnoses:  Atypical chest pain   Patient with history of anxiety. Endorses substernal chest tightness that has been coming and going for the past 2 hours lasting several minutes at a time. Associated with lightheadedness nausea and shortness of  breath.  EKG normal sinus rhythm. No previous cardiac history. CXR negative. Troponin negative. D_dimer negative.  Pain improved in the ED.  Seems atypical for ACS. Patient would however benefit from outpatient stress test. Troponin negative x2.  Patient feeeling better.  Follow up with PCP. Return precautions discussed.   I personally performed the services described in this documentation, which was scribed in my presence. The recorded information has been reviewed and is accurate.   Mariah Essex, MD 05/16/14 [phone]

## 2014-05-15 NOTE — ED Notes (Signed)
Pt arrives with new onset chest pain x 2 hours, dizziness, nausea, and SOB

## 2014-05-15 NOTE — Discharge Instructions (Signed)
Chest Pain (Nonspecific) °There is no evidence of heart attack or blood clot in the lung. Follow up with your doctor. Return to the ED if you develop new or worsening symptoms. °It is often hard to give a specific diagnosis for the cause of chest pain. There is always a chance that your pain could be related to something serious, such as a heart attack or a blood clot in the lungs. You need to follow up with your health care provider for further evaluation. °CAUSES  °· Heartburn. °· Pneumonia or bronchitis. °· Anxiety or stress. °· Inflammation around your heart (pericarditis) or lung (pleuritis or pleurisy). °· A blood clot in the lung. °· A collapsed lung (pneumothorax). It can develop suddenly on its own (spontaneous pneumothorax) or from trauma to the chest. °· Shingles infection (herpes zoster virus). °The chest wall is composed of bones, muscles, and cartilage. Any of these can be the source of the pain. °· The bones can be bruised by injury. °· The muscles or cartilage can be strained by coughing or overwork. °· The cartilage can be affected by inflammation and become sore (costochondritis). °DIAGNOSIS  °Lab tests or other studies may be needed to find the cause of your pain. Your health care provider may have you take a test called an ambulatory electrocardiogram (ECG). An ECG records your heartbeat patterns over a 24-hour period. You may also have other tests, such as: °· Transthoracic echocardiogram (TTE). During echocardiography, sound waves are used to evaluate how blood flows through your heart. °· Transesophageal echocardiogram (TEE). °· Cardiac monitoring. This allows your health care provider to monitor your heart rate and rhythm in real time. °· Holter monitor. This is a portable device that records your heartbeat and can help diagnose heart arrhythmias. It allows your health care provider to track your heart activity for several days, if needed. °· Stress tests by exercise or by giving medicine  that makes the heart beat faster. °TREATMENT  °· Treatment depends on what may be causing your chest pain. Treatment may include: °¨ Acid blockers for heartburn. °¨ Anti-inflammatory medicine. °¨ Pain medicine for inflammatory conditions. °¨ Antibiotics if an infection is present. °· You may be advised to change lifestyle habits. This includes stopping smoking and avoiding alcohol, caffeine, and chocolate. °· You may be advised to keep your head raised (elevated) when sleeping. This reduces the chance of acid going backward from your stomach into your esophagus. °Most of the time, nonspecific chest pain will improve within 2-3 days with rest and mild pain medicine.  °HOME CARE INSTRUCTIONS  °· If antibiotics were prescribed, take them as directed. Finish them even if you start to feel better. °· For the next few days, avoid physical activities that bring on chest pain. Continue physical activities as directed. °· Do not use any tobacco products, including cigarettes, chewing tobacco, or electronic cigarettes. °· Avoid drinking alcohol. °· Only take medicine as directed by your health care provider. °· Follow your health care provider's suggestions for further testing if your chest pain does not go away. °· Keep any follow-up appointments you made. If you do not go to an appointment, you could develop lasting (chronic) problems with pain. If there is any problem keeping an appointment, call to reschedule. °SEEK MEDICAL CARE IF:  °· Your chest pain does not go away, even after treatment. °· You have a rash with blisters on your chest. °· You have a fever. °SEEK IMMEDIATE MEDICAL CARE IF:  °· You have increased   chest pain or pain that spreads to your arm, neck, jaw, back, or abdomen. °· You have shortness of breath. °· You have an increasing cough, or you cough up blood. °· You have severe back or abdominal pain. °· You feel nauseous or vomit. °· You have severe weakness. °· You faint. °· You have chills. °This is an  emergency. Do not wait to see if the pain will go away. Get medical help at once. Call your local emergency services (911 in [place] Do not drive yourself to the hospital. °MAKE SURE YOU:  °· Understand these instructions. °· Will watch your condition. °· Will get help right away if you are not doing well or get worse. °Document Released: 05/14/2005 Document Revised: 08/09/2013 Document Reviewed: 03/09/2008 °[employer]® Patient Information ©2015 [employer]. This information is not intended to replace advice given to you by your health care provider. Make sure you discuss any questions you have with your health care provider. ° °

## 2014-05-15 NOTE — ED Notes (Signed)
Rechecked pt O2 stats, sats 100% while standing.

## 2014-05-30 ENCOUNTER — Other Ambulatory Visit: Payer: Self-pay

## 2014-05-30 ENCOUNTER — Encounter (HOSPITAL_COMMUNITY): Payer: Self-pay | Admitting: Emergency Medicine

## 2014-05-30 ENCOUNTER — Emergency Department (HOSPITAL_COMMUNITY)
Admission: EM | Admit: 2014-05-30 | Discharge: 2014-05-30 | Disposition: A | Discharge status: Home / Self Care | Payer: Medicaid Other | Attending: Emergency Medicine | Admitting: Emergency Medicine

## 2014-05-30 DIAGNOSIS — E669 Obesity, unspecified: Secondary | ICD-10-CM | POA: Insufficient documentation

## 2014-05-30 DIAGNOSIS — I10 Essential (primary) hypertension: Secondary | ICD-10-CM | POA: Insufficient documentation

## 2014-05-30 DIAGNOSIS — R109 Unspecified abdominal pain: Secondary | ICD-10-CM | POA: Insufficient documentation

## 2014-05-30 DIAGNOSIS — Z3202 Encounter for pregnancy test, result negative: Secondary | ICD-10-CM | POA: Insufficient documentation

## 2014-05-30 DIAGNOSIS — I959 Hypotension, unspecified: Secondary | ICD-10-CM | POA: Insufficient documentation

## 2014-05-30 DIAGNOSIS — M545 Low back pain: Secondary | ICD-10-CM | POA: Insufficient documentation

## 2014-05-30 LAB — BASIC METABOLIC PANEL
Anion gap: 15 (ref 5–15)
BUN: 17 mg/dL (ref 6–23)
CALCIUM: 8.6 mg/dL (ref 8.4–10.5)
CO2: 21 mEq/L (ref 19–32)
Chloride: 99 mEq/L (ref 96–112)
Creatinine, Ser: 0.95 mg/dL (ref 0.50–1.10)
GFR, EST AFRICAN AMERICAN: 84 mL/min — AB (ref >90)
GFR, EST NON AFRICAN AMERICAN: 72 mL/min — AB (ref >90)
Glucose, Bld: 174 mg/dL — ABNORMAL HIGH (ref 70–99)
POTASSIUM: 4.4 meq/L (ref 3.7–5.3)
SODIUM: 135 meq/L — AB (ref 137–147)

## 2014-05-30 LAB — CBC WITH DIFFERENTIAL/PLATELET
BASOS PCT: 0 % (ref 0–1)
Basophils Absolute: 0 10*3/uL (ref 0.0–0.1)
EOS ABS: 0 10*3/uL (ref 0.0–0.7)
EOS PCT: 0 % (ref 0–5)
HCT: 34.3 % — ABNORMAL LOW (ref 36.0–46.0)
Hemoglobin: 10.9 g/dL — ABNORMAL LOW (ref 12.0–15.0)
Lymphocytes Relative: 13 % (ref 12–46)
Lymphs Abs: 0.8 10*3/uL (ref 0.7–4.0)
MCH: 26.4 pg (ref 26.0–34.0)
MCHC: 31.8 g/dL (ref 30.0–36.0)
MCV: 83.1 fL (ref 78.0–100.0)
Monocytes Absolute: 0.4 10*3/uL (ref 0.1–1.0)
Monocytes Relative: 6 % (ref 3–12)
NEUTROS PCT: 81 % — AB (ref 43–77)
Neutro Abs: 5.3 10*3/uL (ref 1.7–7.7)
PLATELETS: 201 10*3/uL (ref 150–400)
RBC: 4.13 MIL/uL (ref 3.87–5.11)
RDW: 15.2 % (ref 11.5–15.5)
WBC: 6.6 10*3/uL (ref 4.0–10.5)

## 2014-05-30 LAB — URINE MICROSCOPIC-ADD ON

## 2014-05-30 LAB — URINALYSIS, ROUTINE W REFLEX MICROSCOPIC
Bilirubin Urine: NEGATIVE
Glucose, UA: NEGATIVE mg/dL
HGB URINE DIPSTICK: NEGATIVE
LEUKOCYTES UA: NEGATIVE
Nitrite: POSITIVE — AB
Specific Gravity, Urine: 1.025 (ref 1.005–1.030)
UROBILINOGEN UA: 0.2 mg/dL (ref 0.0–1.0)
pH: 5.5 (ref 5.0–8.0)

## 2014-05-30 LAB — PREGNANCY, URINE: PREG TEST UR: NEGATIVE

## 2014-05-30 MED ORDER — ONDANSETRON HCL 4 MG/2ML IJ SOLN
4.0000 mg | Freq: Once | INTRAMUSCULAR | Status: AC
  Administered 2014-05-30: 4 mg via INTRAVENOUS
  Filled 2014-05-30: qty 2

## 2014-05-30 MED ORDER — SODIUM CHLORIDE 0.9 % IV BOLUS (SEPSIS)
1000.0000 mL | Freq: Once | INTRAVENOUS | Status: AC
  Administered 2014-05-30: 1000 mL via INTRAVENOUS

## 2014-05-30 NOTE — ED Notes (Signed)
Pt donated plasma in [place] today at approximately 11am. Pt states when donating she became dizzy/hot/nausea. Donation was stopped and pt went to emergency room in [place] and was triaged, given ginger ale and placed in waiting room.  Pt states she had a near syncopal episode in the car on the way here. Pt alert and oriented x 4 in triage.

## 2014-05-30 NOTE — ED Provider Notes (Signed)
CSN: [id number]     Arrival date & time 05/30/14  1441 History  This chart was scribed for Mariah Christen, MD by Edison Simon, ED Scribe. This patient was seen in room APA17/APA17 and the patient's care was started at 3:28 PM.    Chief Complaint  Patient presents with  . Hypotension   The history is provided by the patient. No language interpreter was used.    HPI Comments: Mariah Holloway is a 43 y.o. female with history of hypertension who presents to the Emergency Department complaining of hypotension with onset today. She states that she felt a bit nauseas this morning, then went to donate plasma. While donating plasma, she states her nausea increased and she started feeling chills and her blood pressure started dropping. It measures here at 84/46. She states she only had a sandwich before going to donate plasma and has only had a bottle of water to drink. She states she has not donated plasma before. She also reports lower back pain and history of back surgery. No chest pain, dyspnea, syncope.  Severity is minimal to moderate.  Past Medical History  Diagnosis Date  . Hypertension    Past Surgical History  Procedure Laterality Date  . Back surgery    . Breast surgery    . Tubal ligation    . Back surgery     Family History  Problem Relation Age of Onset  . Diabetes Mother   . Hypertension Father    History  Substance Use Topics  . Smoking status: Never Smoker   . Smokeless tobacco: Not on file  . Alcohol Use: No   OB History   Grav Para Term Preterm Abortions TAB SAB Ect Mult Living                 Review of Systems A complete 10 system review of systems was obtained and all systems are negative except as noted in the HPI and PMH.    Allergies  Sulfa antibiotics  Home Medications   Prior to Admission medications   Medication Sig Start Date End Date Taking? Authorizing Provider  citalopram (CELEXA) 20 MG tablet Take 20 mg by mouth at bedtime.    Yes Historical  Provider, MD  traZODone (DESYREL) 100 MG tablet Take 100 mg by mouth at bedtime.    Yes Historical Provider, MD   BP 122/57  Pulse 76  Temp(Src) 98.6 F (37 C) (Axillary)  Resp 22  Ht 5\' 4"  (1.626 m)  Wt 262 lb (118.842 kg)  BMI 44.95 kg/m2  SpO2 98%  LMP 05/13/2014 Physical Exam  Nursing note and vitals reviewed. Constitutional: She is oriented to person, place, and time. She appears well-nourished.  obese  HENT:  Head: Normocephalic and atraumatic.  Eyes: Conjunctivae and EOM are normal. Pupils are equal, round, and reactive to light.  Neck: Normal range of motion. Neck supple.  Cardiovascular: Normal rate, regular rhythm and normal heart sounds.   Pulmonary/Chest: Effort normal and breath sounds normal.  Abdominal: Soft. Bowel sounds are normal.  Musculoskeletal: Normal range of motion.  Tenderness to left flank  Neurological: She is alert and oriented to person, place, and time.  Skin: Skin is warm and dry.  Psychiatric: She has a normal mood and affect. Her behavior is normal.    ED Course  Procedures (including critical care time) Labs Review Labs Reviewed  CBC WITH DIFFERENTIAL - Abnormal; Notable for the following:    Hemoglobin 10.9 (*)    HCT  34.3 (*)    Neutrophils Relative % 81 (*)    All other components within normal limits  BASIC METABOLIC PANEL - Abnormal; Notable for the following:    Sodium 135 (*)    Glucose, Bld 174 (*)    GFR calc non Af Amer 72 (*)    GFR calc Af Amer 84 (*)    All other components within normal limits  URINALYSIS, ROUTINE W REFLEX MICROSCOPIC - Abnormal; Notable for the following:    Ketones, ur TRACE (*)    Protein, ur TRACE (*)    Nitrite POSITIVE (*)    All other components within normal limits  URINE MICROSCOPIC-ADD ON - Abnormal; Notable for the following:    Squamous Epithelial / LPF MANY (*)    Bacteria, UA MANY (*)    All other components within normal limits  PREGNANCY, URINE    Imaging Review No results  found.   EKG Interpretation None     DIAGNOSTIC STUDIES: Oxygen Saturation is 100% on room air, normal by my interpretation.    COORDINATION OF CARE: 3:31 PM Discussed treatment plan with patient at beside, including IV fluids and blood work. The patient agrees with the plan and has no further questions at this time.   Date: 05/30/2014  Rate: 65  Rhythm: normal sinus rhythm  QRS Axis: normal  Intervals: normal  ST/T Wave abnormalities: normal  Conduction Disutrbances: none  Narrative Interpretation: unremarkable    MDM   Final diagnoses:  Hypotension, unspecified hypotension type    Patient feels much better after 2 L of IV fluids.  I think she became dehydrated today while donating plasma secondary to poor oral intake prior to the procedure. Patient feels back to normal at discharge  I personally performed the services described in this documentation, which was scribed in my presence. The recorded information has been reviewed and is accurate.    Mariah Christen, MD 05/30/14 [phone]

## 2014-05-30 NOTE — ED Notes (Signed)
Physician in room with pt.

## 2014-05-30 NOTE — Discharge Instructions (Signed)
Increase fluids.  Eat regular meals

## 2014-05-30 NOTE — ED Notes (Signed)
Discharge instructions given, pt demonstrated teach back and verbal understanding. No concerns voiced.  

## 2014-07-18 ENCOUNTER — Emergency Department (HOSPITAL_COMMUNITY)
Admission: EM | Admit: 2014-07-18 | Discharge: 2014-07-18 | Disposition: A | Discharge status: Home / Self Care | Payer: Medicaid Other | Attending: Emergency Medicine | Admitting: Emergency Medicine

## 2014-07-18 ENCOUNTER — Emergency Department (HOSPITAL_COMMUNITY): Payer: Medicaid Other

## 2014-07-18 ENCOUNTER — Encounter (HOSPITAL_COMMUNITY): Payer: Self-pay | Admitting: Emergency Medicine

## 2014-07-18 DIAGNOSIS — Y9389 Activity, other specified: Secondary | ICD-10-CM | POA: Insufficient documentation

## 2014-07-18 DIAGNOSIS — I10 Essential (primary) hypertension: Secondary | ICD-10-CM | POA: Insufficient documentation

## 2014-07-18 DIAGNOSIS — W010XXA Fall on same level from slipping, tripping and stumbling without subsequent striking against object, initial encounter: Secondary | ICD-10-CM | POA: Insufficient documentation

## 2014-07-18 DIAGNOSIS — S5001XA Contusion of right elbow, initial encounter: Secondary | ICD-10-CM | POA: Insufficient documentation

## 2014-07-18 DIAGNOSIS — Y998 Other external cause status: Secondary | ICD-10-CM | POA: Insufficient documentation

## 2014-07-18 DIAGNOSIS — S3992XA Unspecified injury of lower back, initial encounter: Secondary | ICD-10-CM | POA: Insufficient documentation

## 2014-07-18 DIAGNOSIS — R52 Pain, unspecified: Secondary | ICD-10-CM

## 2014-07-18 DIAGNOSIS — Z79899 Other long term (current) drug therapy: Secondary | ICD-10-CM | POA: Insufficient documentation

## 2014-07-18 DIAGNOSIS — Y929 Unspecified place or not applicable: Secondary | ICD-10-CM | POA: Insufficient documentation

## 2014-07-18 MED ORDER — HYDROCODONE-ACETAMINOPHEN 5-325 MG PO TABS: 1.0000 | ORAL_TABLET | Freq: Four times a day (QID) | ORAL | Status: DC | PRN

## 2014-07-18 MED ORDER — OXYCODONE-ACETAMINOPHEN 5-325 MG PO TABS
1.0000 | ORAL_TABLET | Freq: Once | ORAL | Status: AC
  Administered 2014-07-18: 1 via ORAL

## 2014-07-18 MED ORDER — OXYCODONE-ACETAMINOPHEN 5-325 MG PO TABS
ORAL_TABLET | ORAL | Status: AC
  Filled 2014-07-18: qty 1

## 2014-07-18 NOTE — Discharge Instructions (Signed)
Follow up with dr. Aline Brochure,  The end of this week or next week.    Have your blood pressure checked in 1 week,   It was high in the emergency room.   It may be high secondary to pain

## 2014-07-18 NOTE — ED Provider Notes (Signed)
CSN: [id number]     Arrival date & time 07/18/14  6073 History  This chart was scribed for Maudry Diego, MD by Rayfield Citizen, ED Scribe. This patient was seen in room APA03/APA03 and the patient's care was started at 9:51 AM.    Chief Complaint  Patient presents with  . Pain   Patient is a 43 y.o. female presenting with arm injury. The history is provided by the patient (pt complains of right elbow pain). No language interpreter was used.  Arm Injury Location:  Elbow Time since incident:  2 days Injury: yes   Mechanism of injury: fall   Elbow location:  R elbow Chronicity:  New Dislocation: no   Foreign body present:  No foreign bodies Prior injury to area:  Unable to specify Relieved by:  None tried Worsened by:  Movement Ineffective treatments:  None tried Associated symptoms: back pain   Associated symptoms: no fatigue      HPI Comments: Mariah Holloway is a 43 y.o. female who presents to the Emergency Department complaining of right elbow pain and lower back pain after a simple mechanical fall two days PTA.    Past Medical History  Diagnosis Date  . Hypertension    Past Surgical History  Procedure Laterality Date  . Back surgery    . Breast surgery    . Tubal ligation    . Back surgery     Family History  Problem Relation Age of Onset  . Diabetes Mother   . Hypertension Father    History  Substance Use Topics  . Smoking status: Never Smoker   . Smokeless tobacco: Not on file  . Alcohol Use: No   OB History    No data available     Review of Systems  Constitutional: Negative for appetite change and fatigue.  HENT: Negative for congestion, ear discharge and sinus pressure.   Eyes: Negative for discharge.  Respiratory: Negative for cough.   Cardiovascular: Negative for chest pain.  Gastrointestinal: Negative for abdominal pain and diarrhea.  Genitourinary: Negative for frequency and hematuria.  Musculoskeletal: Positive for back pain and arthralgias  (right elbow).  Skin: Negative for rash.  Neurological: Negative for seizures and headaches.  Psychiatric/Behavioral: Negative for hallucinations.    Allergies  Sulfa antibiotics  Home Medications   Prior to Admission medications   Medication Sig Start Date End Date Taking? Authorizing Provider  citalopram (CELEXA) 20 MG tablet Take 20 mg by mouth at bedtime.     Historical Provider, MD  traZODone (DESYREL) 100 MG tablet Take 100 mg by mouth at bedtime.     Historical Provider, MD   There were no vitals taken for this visit. Physical Exam  Constitutional: She is oriented to person, place, and time. She appears well-developed.  HENT:  Head: Normocephalic.  Eyes: Conjunctivae are normal.  Neck: No tracheal deviation present.  Cardiovascular:  No murmur heard. Musculoskeletal: Normal range of motion.  Moderate tenderness around right elbow; minimal lower lumbar spinal tenderness  Neurological: She is oriented to person, place, and time.  Skin: Skin is warm.  Psychiatric: She has a normal mood and affect.    ED Course  Procedures   DIAGNOSTIC STUDIES: Oxygen Saturation is 100% on RA, normal by my interpretation.    COORDINATION OF CARE: 9:56 AM Discussed treatment plan with pt at bedside and pt agreed to plan.   Labs Review Labs Reviewed - No data to display  Imaging Review No results found.  EKG Interpretation None      MDM   Final diagnoses:  None   Contusion rt. Elbow,   Htn,   Will follow up soon  The chart was scribed for me under my direct supervision.  I personally performed the history, physical, and medical decision making and all procedures in the evaluation of this patient.Maudry Diego, MD 07/18/14 [phone]

## 2014-07-18 NOTE — ED Notes (Signed)
Pt reports slipped and fell twice x2 days. Pt reports right arm,left knee and lower back pain ever since. nad noted.

## 2014-08-09 ENCOUNTER — Encounter (HOSPITAL_COMMUNITY): Payer: Self-pay | Admitting: *Deleted

## 2014-08-09 ENCOUNTER — Emergency Department (HOSPITAL_COMMUNITY)
Admission: EM | Admit: 2014-08-09 | Discharge: 2014-08-09 | Disposition: A | Discharge status: Home / Self Care | Payer: Medicaid Other | Attending: Emergency Medicine | Admitting: Emergency Medicine

## 2014-08-09 DIAGNOSIS — I10 Essential (primary) hypertension: Secondary | ICD-10-CM | POA: Insufficient documentation

## 2014-08-09 DIAGNOSIS — Z13828 Encounter for screening for other musculoskeletal disorder: Secondary | ICD-10-CM | POA: Insufficient documentation

## 2014-08-09 DIAGNOSIS — Z139 Encounter for screening, unspecified: Secondary | ICD-10-CM

## 2014-08-09 NOTE — ED Notes (Signed)
Pt states that she needs a note for work to return back to work, pt states that she did not f/u with ortho due to finances

## 2014-08-11 NOTE — ED Provider Notes (Signed)
CSN: [id number]     Arrival date & time 08/09/14  1239 History   First MD Initiated Contact with Patient 08/09/14 1254     Chief Complaint  Patient presents with  . release back to work      (Consider location/radiation/quality/duration/timing/severity/associated sxs/prior Treatment) HPI   Mariah Holloway is a 43 y.o. female who presents to the Emergency Department requesting a return to work note.  She states she was seen here approximately one month ago for an injury to her right elbow.  She was advised to f/u with orthopedics, but she states she did not because she was unable to afford the visit  She states the pain to her elbow gradually resolved and she denies any pain with movement, numbness or swelling.  She states she feels able to return to her full work duties.     Past Medical History  Diagnosis Date  . Hypertension    Past Surgical History  Procedure Laterality Date  . Back surgery    . Breast surgery    . Tubal ligation    . Back surgery     Family History  Problem Relation Age of Onset  . Diabetes Mother   . Hypertension Father    History  Substance Use Topics  . Smoking status: Never Smoker   . Smokeless tobacco: Not on file  . Alcohol Use: No   OB History    No data available     Review of Systems  Constitutional: Negative for fever and chills.  Genitourinary: Negative for dysuria and difficulty urinating.  Musculoskeletal: Negative for back pain, joint swelling and arthralgias.  Skin: Negative for color change and wound.  All other systems reviewed and are negative.     Allergies  Sulfa antibiotics  Home Medications   Prior to Admission medications   Medication Sig Start Date End Date Taking? Authorizing Provider  HYDROcodone-acetaminophen (NORCO/VICODIN) 5-325 MG per tablet Take 1 tablet by mouth every 6 (six) hours as needed. 07/18/14   Maudry Diego, MD   BP 138/62 mmHg  Pulse 112  Temp(Src) 97.9 F (36.6 C) (Oral)  Ht 5\' 4"   (1.626 m)  Wt 260 lb (117.935 kg)  BMI 44.61 kg/m2  LMP 07/16/2014 Physical Exam  Constitutional: She is oriented to person, place, and time. She appears well-developed and well-nourished. No distress.  HENT:  Head: Normocephalic and atraumatic.  Neck: Normal range of motion. Neck supple.  Cardiovascular: Normal rate, regular rhythm, normal heart sounds and intact distal pulses.   No murmur heard. Pulmonary/Chest: Effort normal and breath sounds normal. No respiratory distress. She exhibits no tenderness.  Abdominal: Soft. She exhibits no distension. There is no tenderness.  Musculoskeletal: Normal range of motion. She exhibits no edema or tenderness.  Pt has full ROM of the right elbow.  No edema, NV intact  Lymphadenopathy:    She has no cervical adenopathy.  Neurological: She is alert and oriented to person, place, and time. She exhibits normal muscle tone. Coordination normal.  Skin: Skin is warm and dry.  Nursing note and vitals reviewed.   ED Course  Procedures (including critical care time) Labs Review Labs Reviewed - No data to display  Imaging Review No results found.   EKG Interpretation None      MDM   Final diagnoses:  Encounter for medical screening examination    Pt here in ED for return to work note.  Denies any pain or other symptoms at present.  Work note written.  Pt stable for d/c    Lupe Bonner L. Ammie Ferrier 08/11/14 [place], MD 08/11/14 1946

## 2014-12-16 ENCOUNTER — Emergency Department (HOSPITAL_COMMUNITY): Payer: Medicaid Other

## 2014-12-16 ENCOUNTER — Encounter (HOSPITAL_COMMUNITY): Payer: Self-pay | Admitting: Emergency Medicine

## 2014-12-16 ENCOUNTER — Emergency Department (HOSPITAL_COMMUNITY)
Admission: EM | Admit: 2014-12-16 | Discharge: 2014-12-16 | Disposition: A | Discharge status: Home / Self Care | Payer: Medicaid Other | Attending: Emergency Medicine | Admitting: Emergency Medicine

## 2014-12-16 DIAGNOSIS — D649 Anemia, unspecified: Secondary | ICD-10-CM

## 2014-12-16 DIAGNOSIS — M7989 Other specified soft tissue disorders: Secondary | ICD-10-CM | POA: Insufficient documentation

## 2014-12-16 DIAGNOSIS — G8929 Other chronic pain: Secondary | ICD-10-CM | POA: Insufficient documentation

## 2014-12-16 DIAGNOSIS — R600 Localized edema: Secondary | ICD-10-CM

## 2014-12-16 DIAGNOSIS — M545 Low back pain, unspecified: Secondary | ICD-10-CM

## 2014-12-16 DIAGNOSIS — I10 Essential (primary) hypertension: Secondary | ICD-10-CM | POA: Insufficient documentation

## 2014-12-16 LAB — URINALYSIS, ROUTINE W REFLEX MICROSCOPIC
Bilirubin Urine: NEGATIVE
GLUCOSE, UA: NEGATIVE mg/dL
HGB URINE DIPSTICK: NEGATIVE
Ketones, ur: NEGATIVE mg/dL
Leukocytes, UA: NEGATIVE
Nitrite: NEGATIVE
PROTEIN: NEGATIVE mg/dL
Specific Gravity, Urine: >1.03 — ABNORMAL HIGH (ref 1.005–1.030)
Urobilinogen, UA: 0.2 mg/dL (ref 0.0–1.0)
pH: 6 (ref 5.0–8.0)

## 2014-12-16 LAB — CBC WITH DIFFERENTIAL/PLATELET
Basophils Absolute: 0 10*3/uL (ref 0.0–0.1)
Basophils Relative: 0 % (ref 0–1)
EOS ABS: 0.1 10*3/uL (ref 0.0–0.7)
EOS PCT: 1 % (ref 0–5)
HCT: 24.1 % — ABNORMAL LOW (ref 36.0–46.0)
HEMOGLOBIN: 7.4 g/dL — AB (ref 12.0–15.0)
Lymphocytes Relative: 37 % (ref 12–46)
Lymphs Abs: 2 10*3/uL (ref 0.7–4.0)
MCH: 22.2 pg — AB (ref 26.0–34.0)
MCHC: 30.7 g/dL (ref 30.0–36.0)
MCV: 72.4 fL — AB (ref 78.0–100.0)
MONO ABS: 0.4 10*3/uL (ref 0.1–1.0)
MONOS PCT: 7 % (ref 3–12)
NEUTROS ABS: 3 10*3/uL (ref 1.7–7.7)
Neutrophils Relative %: 55 % (ref 43–77)
Platelets: 296 10*3/uL (ref 150–400)
RBC: 3.33 MIL/uL — AB (ref 3.87–5.11)
RDW: 17.5 % — ABNORMAL HIGH (ref 11.5–15.5)
WBC: 5.5 10*3/uL (ref 4.0–10.5)

## 2014-12-16 LAB — COMPREHENSIVE METABOLIC PANEL
ALT: 17 U/L (ref 0–35)
AST: 20 U/L (ref 0–37)
Albumin: 3.6 g/dL (ref 3.5–5.2)
Alkaline Phosphatase: 55 U/L (ref 39–117)
Anion gap: 8 (ref 5–15)
BUN: 12 mg/dL (ref 6–23)
CALCIUM: 8.2 mg/dL — AB (ref 8.4–10.5)
CO2: 22 mmol/L (ref 19–32)
Chloride: 107 mmol/L (ref 96–112)
Creatinine, Ser: 0.61 mg/dL (ref 0.50–1.10)
Glucose, Bld: 82 mg/dL (ref 70–99)
Potassium: 3.4 mmol/L — ABNORMAL LOW (ref 3.5–5.1)
Sodium: 137 mmol/L (ref 135–145)
Total Bilirubin: 0.4 mg/dL (ref 0.3–1.2)
Total Protein: 7.2 g/dL (ref 6.0–8.3)

## 2014-12-16 MED ORDER — FENTANYL CITRATE (PF) 100 MCG/2ML IJ SOLN
50.0000 ug | Freq: Once | INTRAMUSCULAR | Status: AC
  Administered 2014-12-16: 50 ug via INTRAMUSCULAR
  Filled 2014-12-16: qty 2

## 2014-12-16 MED ORDER — NAPROXEN 500 MG PO TABS: 500.0000 mg | ORAL_TABLET | Freq: Two times a day (BID) | ORAL | Status: DC

## 2014-12-16 MED ORDER — HYDROCODONE-ACETAMINOPHEN 5-325 MG PO TABS: 1.0000 | ORAL_TABLET | Freq: Four times a day (QID) | ORAL | Status: DC | PRN

## 2014-12-16 MED ORDER — ONDANSETRON 4 MG PO TBDP
4.0000 mg | ORAL_TABLET | Freq: Once | ORAL | Status: AC
  Administered 2014-12-16: 4 mg via ORAL
  Filled 2014-12-16: qty 1

## 2014-12-16 MED ORDER — PRENATAL VITAMINS 28-0.8 MG PO TABS: 1.0000 | ORAL_TABLET | Freq: Every day | ORAL | Status: DC

## 2014-12-16 NOTE — ED Notes (Signed)
Pt made aware to return if symptoms worsen or if any life threatening symptoms occur.   

## 2014-12-16 NOTE — Discharge Instructions (Signed)
Important to follow-up for the anemia. Start the prenatal vitamins for the iron. But as we discussed her blood counts are very low and you need additional workup for this. Resource guide provided below to help you find a regular doctor. Or use the health department. Take the Naprosyn on a regular basis for the back take the hydrocodone as needed for more severe back discomfort. Try to rest as much possible for the next 2 days.   Emergency Department Resource Guide 1) Find a Doctor and Pay Out of Pocket Although you won't have to find out who is covered by your insurance plan, it is a good idea to ask around and get recommendations. You will then need to call the office and see if the doctor you have chosen will accept you as a new patient and what types of options they offer for patients who are self-pay. Some doctors offer discounts or will set up payment plans for their patients who do not have insurance, but you will need to ask so you aren't surprised when you get to your appointment.  2) Contact Your Local Health Department Not all health departments have doctors that can see patients for sick visits, but many do, so it is worth a call to see if yours does. If you don't know where your local health department is, you can check in your phone book. The CDC also has a tool to help you locate your state's health department, and many state websites also have listings of all of their local health departments.  3) Find a [facility] If your illness is not likely to be very severe or complicated, you may want to try a walk in clinic. These are popping up all over the country in pharmacies, drugstores, and shopping centers. They're usually staffed by nurse practitioners or physician assistants that have been trained to treat common illnesses and complaints. They're usually fairly quick and inexpensive. However, if you have serious medical issues or chronic medical problems, these are probably not your best  option.  No Primary Care Doctor: - Call Health Connect at  [phone] - they can help you locate a primary care doctor that  accepts your insurance, provides certain services, etc. - Physician Referral Service- [phone]  Chronic Pain Problems: Organization         Address  Phone   Notes  [facility]  [phone] Patients need to be referred by their primary care doctor.   Medication Assistance: Organization         Address  Phone   Notes  [facility] Medication [facility] [address] [place], [place] [address] [phone] --Must be a resident of [facility] -- Must have NO insurance coverage whatsoever (no Medicaid/ Medicare, etc.) -- The pt. MUST have a primary care doctor that directs their care regularly and follows them in the community   MedAssist  [phone]   [employer]  [phone]    Agencies that provide inexpensive medical care: Organization         Address  Phone   Notes  [place]  [phone]   [facility]    [phone]   [facility] [place], [place] [address] [phone]   [place] [address] [place] [phone]   Planned Parenthood    [phone]   [facility]    [phone])  [place]  [address], [place] Phone:  [phone], Fax:  [phone] Hours of Operation:  9 am - 6 pm, M-F.  Also accepts Medicaid/Medicare and self-pay.  [facility] for [place] [place], [address], [place] Phone: [phone], Fax: [phone]. Hours of Operation:  8:30 am - 5:30 pm, M-F.  Also accepts Medicaid and self-pay.  [facility] [address] [place] Phone: [phone]   [place], [place], [place] [phone], Ext. [phone] Mondays & Thursdays: 7-9 AM.  First 15  patients are seen on a first come, first serve basis.    [place] Providers:  Organization         Address  Phone   Notes  [facility] [address], [place] [phone] Also accepts self-pay patients.  [facility] [address], [place]  [phone]   [place], [address], [place] [phone]   [facility] Family Medicine [address] [place] [phone]   Lucianne Lei [address], [place]   [phone] Only accepts [place] Access [place] patients after they have their name applied to their card.   Self-Pay (no insurance) in [facility]:  Organization         Address  Phone   Notes  Sickle Cell Patients, [facility] [place] [phone]   [facility] [place] [phone]   [facility] [place]  [place], [place], [place] [phone]   [facility]/Dr. Osei-Bonsu  [address] [place] or [address], [place] [phone] Phone number for both [place] and [place] locations is the same.  Urgent Medical and [facility] [address] [place] [phone]   [facility] [address] [place] or [address] [phone]   [facility] [address] [place] [phone], phone; [phone], fax Sees patients 1st and 3rd Saturday of every month.  Must not qualify for public or private insurance (i.e. Medicaid, Medicare, [place] Health Choice, Veterans' Benefits)  Household income should be no more than 200% of the poverty level The clinic cannot treat you if you are pregnant or think you are pregnant  Sexually transmitted diseases are not treated at the clinic.    Dental  Care: Organization         Address  Phone  Notes  [facility] Department of [facility] [place] [phone] Accepts children up to age 86 who are enrolled in [place] or [place]; pregnant women with a Medicaid card; and children who have applied for Medicaid or [place] Health Choice, but were declined, whose parents can pay a reduced fee at time of service.  [facility] Department of [facility]  [address], [place] [phone] Accepts children up to age 51 who are enrolled in [place] or [place]; pregnant women with a Medicaid card; and children who have applied for Medicaid or [place] Health Choice, but were declined, whose parents can pay a reduced fee at time of service.  [place] Adult Dental Access  PROGRAM  [place] [phone] Patients are seen by appointment only. Walk-ins are not accepted.  [place] will see patients 70 years of age and older. Monday - Tuesday (8am-5pm) Most Wednesdays (8:30-5pm) $30 per visit, cash only  [facility] Adult Dental Access PROGRAM  [address], [facility] [phone] Patients are seen by appointment only. Walk-ins are not accepted. [place] will see patients 37 years of age and older. One Wednesday Evening (Monthly: Volunteer Based).  $30 per visit, cash only  [place]  [phone] for adults; Children under age 67, call Graduate Pediatric Dentistry at [phone]. Children aged 40-14, please call [phone] to request a pediatric application.  Dental services are provided in all areas of dental care including fillings, crowns and bridges, complete and partial dentures, implants, gum treatment, root canals, and extractions. Preventive care is also provided. Treatment is provided to both adults and children. Patients are selected via a lottery and there is often a waiting list.   [facility] [address], [place]  [phone] [email or web address]   [facility] [address] [place], [place] [phone], Ext. [phone] Second and Fourth Thursday of each month, opens at 6:30 AM; Clinic ends at 9 AM.  Patients are seen on a first-come first-served basis, and a limited number are seen during each clinic.   [facility]  [address] Hillard Danker [place], [place] [phone]   Eligibility Requirements You must have lived in [place], [place], or [place] counties for at least the last three months.   You cannot be eligible for state or federal sponsored [employer], including [employer], [place], or [employer].   You generally cannot be eligible for healthcare insurance through your employer.    How to apply: Eligibility screenings are held every Tuesday and Wednesday afternoon from 1:00 pm until 4:00 pm. You do not need an appointment for the interview!  [facility] [address] [place], [place]   [place]  [place] Department  [place]  [phone]    Behavioral Health Resources in the Community: Intensive Outpatient Programs Organization         Address  Phone  Notes  [place]. [address], [place], [place] [phone]   [facility] Outpatient [address] [place], [place]   ADS: Alcohol & Drug Svcs [address] [place], [place]   [place] [address]  [place], [place] or [phone]   Substance Abuse Resources Organization         Address  Phone  Notes  Alcohol and Drug Services  [phone]   [place]  [phone]   [facility] [place]   Chinita Pester  [phone]   Residential & Outpatient Substance Abuse Program  [phone]    Psychological Services Organization         Address  Phone  Notes  [facility] [place]  [place]  [phone]   [place] [address] [place] or [phone]    Mobile Crisis Teams Organization         Address  Phone  Notes  [facility], Mobile Crisis Care Unit  [phone]   [facility]  [address] [place], [place]  [place], [place] [phone]    Self-Help/Support Groups Organization         Address  Phone             Notes  [facility] of [place] - variety of support groups  [place] Call for more information  Narcotics Anonymous (NA), [facility] [address], [employer] [place]  2 meetings at this location   Special educational needs teacher         Address  Phone  Notes  ASAP Residential Treatment Welcome,    [place]  [phone]   [facility]  [address], [place] [address], [place], [place]   [place], [place] [phone] Admissions: 8am-3pm M-F  Incentives Substance [place] [address],    [place], [place] [phone]   [facility] [address] [place], [place], [place]   [facility] [place] [address],  [place], [place]   [facility] Programs - Intensive Outpatient [address], [place], [place]   [facility] [address]  [place], [place] [phone] or [phone]   [facility]idential Treatment Services (RTS) [address], [address], [place] Accepts Medicaid  [facility] [place] [address]  [place] [phone] Substance Abuse/Addiction Treatment   [facility] Organization         Address  Phone  Notes  [facility]  [phone]   Domenic Schwab, PhD [address] Arlis Porta [place], [place]   [phone] or [phone]   [place], [place] [phone]   [facility] [address] [place], [place] [phone] Insurance/Medicaid/sponsorship through [facility] and Families [address]                                    [place], [place] [phone] [place] [address], [place] [phone]    Dr. Adele Schilder  [phone]   [facility] Dept. 1) [address] [place] 2) [place] 3)  [place] [address], [place] [phone]  [phone]   [place] [phone] or [phone] (After Hours)

## 2014-12-16 NOTE — ED Notes (Signed)
PT c/o lower back pain x4 days with c/o increased urinary frequency. PT states taking tylenol with no relief prior to ED arrival and also c/o bilateral leg swelling x4 days.

## 2014-12-16 NOTE — ED Provider Notes (Signed)
CSN: [id number]     Arrival date & time 12/16/14  1255 History   First MD Initiated Contact with Patient 12/16/14 1456     Chief Complaint  Patient presents with  . Back Pain     (Consider location/radiation/quality/duration/timing/severity/associated sxs/prior Treatment) Patient is a 44 y.o. female presenting with back pain. The history is provided by the patient.  Back Pain Associated symptoms: no abdominal pain, no chest pain, no dysuria, no fever, no headaches and no weakness    the patient presents with a four-day history of low back pain bilateral. No radiation of pain. Patient's had trouble with the back in the past but this is an acute exacerbation. Pain is 10 out of 10 worse with movement. Sharp in nature ache in nature. Patient also with concern for of bilateral leg swelling has been ongoing for a while. No shortness of breath no chest pain. No lightheadedness no dizziness. Patient denies any injury to the back. Patient followed by the health department in the past.  Past Medical History  Diagnosis Date  . Hypertension    Past Surgical History  Procedure Laterality Date  . Back surgery    . Breast surgery    . Tubal ligation    . Back surgery     Family History  Problem Relation Age of Onset  . Diabetes Mother   . Hypertension Father    History  Substance Use Topics  . Smoking status: Never Smoker   . Smokeless tobacco: Not on file  . Alcohol Use: No   OB History    Gravida Para Term Preterm AB TAB SAB Ectopic Multiple Living            2     Review of Systems  Constitutional: Negative for fever and fatigue.  HENT: Negative for congestion.   Eyes: Negative for visual disturbance.  Respiratory: Negative for shortness of breath.   Cardiovascular: Positive for leg swelling. Negative for chest pain.  Gastrointestinal: Negative for nausea, vomiting and abdominal pain.  Genitourinary: Negative for dysuria and vaginal bleeding.  Musculoskeletal: Positive for back  pain. Negative for neck pain.  Skin: Negative for rash.  Neurological: Negative for dizziness, syncope, weakness, light-headedness and headaches.  Hematological: Does not bruise/bleed easily.  Psychiatric/Behavioral: Negative for confusion.      Allergies  Sulfa antibiotics  Home Medications   Prior to Admission medications   Medication Sig Start Date End Date Taking? Authorizing Provider  acetaminophen (TYLENOL) 500 MG tablet Take 500-1,000 mg by mouth every 6 (six) hours as needed for mild pain, moderate pain or headache.   Yes Historical Provider, MD  naproxen sodium (ANAPROX) 220 MG tablet Take 220-440 mg by mouth daily as needed (for pain).   Yes Historical Provider, MD  HYDROcodone-acetaminophen (NORCO/VICODIN) 5-325 MG per tablet Take 1 tablet by mouth every 6 (six) hours as needed. Patient not taking: Reported on 12/16/2014 07/18/14   Milton Ferguson, MD  HYDROcodone-acetaminophen (NORCO/VICODIN) 5-325 MG per tablet Take 1-2 tablets by mouth every 6 (six) hours as needed. 12/16/14   Fredia Sorrow, MD  naproxen (NAPROSYN) 500 MG tablet Take 1 tablet (500 mg total) by mouth 2 (two) times daily. 12/16/14   Fredia Sorrow, MD  Prenatal Vit-Fe Fumarate-FA (PRENATAL VITAMINS) 28-0.8 MG TABS Take 1 tablet by mouth daily. 12/16/14   Fredia Sorrow, MD   BP 168/89 mmHg  Pulse 74  Temp(Src) 98 F (36.7 C) (Oral)  Resp 20  Ht 5\' 4"  (1.626 m)  Wt 263 lb  6 oz (119.466 kg)  BMI 45.19 kg/m2  SpO2 100%  LMP 12/10/2014 Physical Exam  Constitutional: She is oriented to person, place, and time. She appears well-developed and well-nourished. No distress.  HENT:  Head: Normocephalic and atraumatic.  Mouth/Throat: Oropharynx is clear and moist.  Eyes: Conjunctivae and EOM are normal. Pupils are equal, round, and reactive to light.  Neck: Normal range of motion.  Cardiovascular: Normal rate, regular rhythm and normal heart sounds.   No murmur heard. Pulmonary/Chest: Effort normal and breath  sounds normal. No respiratory distress.  Abdominal: Soft. Bowel sounds are normal. There is no tenderness.  Musculoskeletal: Normal range of motion. She exhibits edema. She exhibits no tenderness.  Neurological: She is alert and oriented to person, place, and time. No cranial nerve deficit. She exhibits normal muscle tone. Coordination normal.  Skin: Skin is warm. No rash noted.  Nursing note and vitals reviewed.   ED Course  Procedures (including critical care time) Labs Review Labs Reviewed  URINALYSIS, ROUTINE W REFLEX MICROSCOPIC - Abnormal; Notable for the following:    Specific Gravity, Urine >1.030 (*)    All other components within normal limits  COMPREHENSIVE METABOLIC PANEL - Abnormal; Notable for the following:    Potassium 3.4 (*)    Calcium 8.2 (*)    All other components within normal limits  CBC WITH DIFFERENTIAL/PLATELET - Abnormal; Notable for the following:    RBC 3.33 (*)    Hemoglobin 7.4 (*)    HCT 24.1 (*)    MCV 72.4 (*)    MCH 22.2 (*)    RDW 17.5 (*)    All other components within normal limits    Imaging Review Dg Chest 2 View  12/16/2014   CLINICAL DATA:  Low back pain.  Leg edema.  EXAM: CHEST  2 VIEW  COMPARISON:  05/15/2014  FINDINGS: Midline trachea.  Normal heart size and mediastinal contours.  Sharp costophrenic angles.  No pneumothorax.  Clear lungs.  IMPRESSION: No active cardiopulmonary disease.   Electronically Signed   By: Abigail Miyamoto M.D.   On: 12/16/2014 18:14     EKG Interpretation   Date/Time:  Saturday December 16 2014 16:30:56 EDT Ventricular Rate:  63 PR Interval:  137 QRS Duration: 81 QT Interval:  438 QTC Calculation: 448 R Axis:   36 Text Interpretation:  Sinus rhythm Low voltage, precordial leads No  significant change since last tracing Confirmed by Kaylen Motl  MD, Faisal Stradling  [phone]) on 12/16/2014 4:33:39 PM      MDM   Final diagnoses:  Bilateral leg edema  Anemia, unspecified anemia type  Bilateral low back pain without  sciatica    The patient presented with concern for back pain bilateral low back pain present for 4 days. Seems to be musculoskeletal in nature. No injury. Patient's had some chronic back pain in the past but this is an acute exacerbation. No neuro focal deficits to the legs. Patient's other concern was for edema to both legs. There was some swelling to both legs but no pitting edema. Workup for that without any significant findings. EKG without any acute changes or arrhythmia. No significant electrolyte abnormalities. However CBC showed a markedly anemia. Patient stated that she been on iron in the past. Denies any heavy vaginal bleeding. Denies any blood in her bowel movements.  We'll restart patient back on iron in the form a prenatal vitamin. Will treat the back with the Naprosyn and a 10 tablet course of the hydrocodone. Resource guide provided to  patient find a regular Dr. follow-up. Patient apparently has used to health department in the past. Patient nontoxic no acute distress. Vital signs without any tachycardia no hypotension. Blood pressure actually on the high side.    Fredia Sorrow, MD 12/16/14 [phone]

## 2017-06-30 ENCOUNTER — Emergency Department (HOSPITAL_COMMUNITY)
Admission: EM | Admit: 2017-06-30 | Discharge: 2017-06-30 | Disposition: A | Discharge status: Home / Self Care | Payer: Self-pay | Attending: Emergency Medicine | Admitting: Emergency Medicine

## 2017-06-30 ENCOUNTER — Encounter (HOSPITAL_COMMUNITY): Payer: Self-pay | Admitting: Emergency Medicine

## 2017-06-30 DIAGNOSIS — Z5321 Procedure and treatment not carried out due to patient leaving prior to being seen by health care provider: Secondary | ICD-10-CM | POA: Insufficient documentation

## 2017-06-30 DIAGNOSIS — R1110 Vomiting, unspecified: Secondary | ICD-10-CM | POA: Insufficient documentation

## 2017-06-30 LAB — CBC
HCT: 31.4 % — ABNORMAL LOW (ref 36.0–46.0)
Hemoglobin: 10 g/dL — ABNORMAL LOW (ref 12.0–15.0)
MCH: 25.8 pg — ABNORMAL LOW (ref 26.0–34.0)
MCHC: 31.8 g/dL (ref 30.0–36.0)
MCV: 81.1 fL (ref 78.0–100.0)
Platelets: 256 K/uL (ref 150–400)
RBC: 3.87 MIL/uL (ref 3.87–5.11)
RDW: 16 % — ABNORMAL HIGH (ref 11.5–15.5)
WBC: 4.9 K/uL (ref 4.0–10.5)

## 2017-06-30 LAB — URINALYSIS, ROUTINE W REFLEX MICROSCOPIC
Bilirubin Urine: NEGATIVE
Glucose, UA: NEGATIVE mg/dL
Ketones, ur: NEGATIVE mg/dL
Leukocytes, UA: NEGATIVE
Nitrite: NEGATIVE
Protein, ur: 30 mg/dL — AB
Specific Gravity, Urine: 1.014 (ref 1.005–1.030)
pH: 6 (ref 5.0–8.0)

## 2017-06-30 LAB — COMPREHENSIVE METABOLIC PANEL WITH GFR
ALT: 17 U/L (ref 14–54)
AST: 18 U/L (ref 15–41)
Albumin: 3.4 g/dL — ABNORMAL LOW (ref 3.5–5.0)
Alkaline Phosphatase: 56 U/L (ref 38–126)
Anion gap: 6 (ref 5–15)
BUN: 12 mg/dL (ref 6–20)
CO2: 23 mmol/L (ref 22–32)
Calcium: 8.4 mg/dL — ABNORMAL LOW (ref 8.9–10.3)
Chloride: 109 mmol/L (ref 101–111)
Creatinine, Ser: 0.53 mg/dL (ref 0.44–1.00)
GFR calc Af Amer: >60 mL/min
GFR calc non Af Amer: >60 mL/min
Glucose, Bld: 133 mg/dL — ABNORMAL HIGH (ref 65–99)
Potassium: 3.4 mmol/L — ABNORMAL LOW (ref 3.5–5.1)
Sodium: 138 mmol/L (ref 135–145)
Total Bilirubin: 0.5 mg/dL (ref 0.3–1.2)
Total Protein: 7.2 g/dL (ref 6.5–8.1)

## 2017-06-30 LAB — PREGNANCY, URINE: Preg Test, Ur: NEGATIVE

## 2017-06-30 LAB — LIPASE, BLOOD: Lipase: 24 U/L (ref 11–51)

## 2017-06-30 NOTE — ED Notes (Signed)
Pt called for room. No answer in waiting room.

## 2017-06-30 NOTE — ED Triage Notes (Signed)
Pt reports she has been N/ since last night, has had 3 episodes of V/ today, denies GU sx or D/.

## 2018-02-14 NOTE — Congregational Nurse Program (Signed)
Congregational Nurse Program Note  Date of Encounter: 02/14/2018  Past Medical History: Past Medical History:  Diagnosis Date  . Hypertension     Encounter Details: CNP Questionnaire - 01/27/18 1000      Questionnaire   Patient Status  Not Applicable    Race  Black or African American    Location Patient Served At  [employer] [employer]  Not Applicable    Uninsured  Uninsured (NEW 1x/quarter)    Food  No food insecurities    Housing/Utilities  Yes, have permanent housing    Transportation  No transportation needs    Interpersonal Safety  Yes, feel physically and emotionally safe where you currently live    Medication  No medication insecurities    Medical Provider  No    Referrals  [facility]    ED Visit Averted  Not Applicable    Life-Saving Intervention Made  Not Applicable      Patient seen at the [facility]. Stated she has history of HTN and mostly followed at the ER. Info given for referral to [facility] [place]. Stated she hasn't taken her medication today.Discussed the importance of taking her meds as prescribed, following a low sodium diet,getting proper exercise as well as the proper rest. Voiced understanding and will call clinic to be screened for service  B/P 173/87- P [place], [place] [facility] [phone]

## 2018-03-17 ENCOUNTER — Encounter (HOSPITAL_COMMUNITY): Payer: Self-pay

## 2018-03-17 ENCOUNTER — Emergency Department (HOSPITAL_COMMUNITY): Payer: Self-pay

## 2018-03-17 ENCOUNTER — Emergency Department (HOSPITAL_COMMUNITY)
Admission: EM | Admit: 2018-03-17 | Discharge: 2018-03-17 | Disposition: A | Discharge status: Home / Self Care | Payer: Self-pay | Attending: Emergency Medicine | Admitting: Emergency Medicine

## 2018-03-17 ENCOUNTER — Other Ambulatory Visit: Payer: Self-pay

## 2018-03-17 DIAGNOSIS — D649 Anemia, unspecified: Secondary | ICD-10-CM | POA: Insufficient documentation

## 2018-03-17 DIAGNOSIS — Z9119 Patient's noncompliance with other medical treatment and regimen: Secondary | ICD-10-CM | POA: Insufficient documentation

## 2018-03-17 DIAGNOSIS — K029 Dental caries, unspecified: Secondary | ICD-10-CM | POA: Insufficient documentation

## 2018-03-17 DIAGNOSIS — I10 Essential (primary) hypertension: Secondary | ICD-10-CM | POA: Insufficient documentation

## 2018-03-17 DIAGNOSIS — Z760 Encounter for issue of repeat prescription: Secondary | ICD-10-CM | POA: Insufficient documentation

## 2018-03-17 DIAGNOSIS — Z79899 Other long term (current) drug therapy: Secondary | ICD-10-CM | POA: Insufficient documentation

## 2018-03-17 DIAGNOSIS — Z9114 Patient's other noncompliance with medication regimen: Secondary | ICD-10-CM

## 2018-03-17 HISTORY — DX: Anemia, unspecified: D64.9

## 2018-03-17 HISTORY — DX: Patient's other noncompliance with medication regimen for other reason: Z91.148

## 2018-03-17 HISTORY — DX: Radiculopathy, lumbar region: M54.16

## 2018-03-17 HISTORY — DX: Dorsalgia, unspecified: M54.9

## 2018-03-17 HISTORY — DX: Patient's other noncompliance with medication regimen: Z91.14

## 2018-03-17 LAB — COMPREHENSIVE METABOLIC PANEL
ALK PHOS: 56 U/L (ref 38–126)
ALT: 15 U/L (ref 0–44)
AST: 18 U/L (ref 15–41)
Albumin: 3.5 g/dL (ref 3.5–5.0)
Anion gap: 6 (ref 5–15)
BUN: 9 mg/dL (ref 6–20)
CALCIUM: 8.6 mg/dL — AB (ref 8.9–10.3)
CO2: 25 mmol/L (ref 22–32)
CREATININE: 0.66 mg/dL (ref 0.44–1.00)
Chloride: 109 mmol/L (ref 98–111)
GFR calc non Af Amer: >60 mL/min (ref >60)
Glucose, Bld: 91 mg/dL (ref 70–99)
Potassium: 3.7 mmol/L (ref 3.5–5.1)
SODIUM: 140 mmol/L (ref 135–145)
Total Bilirubin: 0.6 mg/dL (ref 0.3–1.2)
Total Protein: 7.5 g/dL (ref 6.5–8.1)

## 2018-03-17 LAB — CBC WITH DIFFERENTIAL/PLATELET
BASOS ABS: 0 10*3/uL (ref 0.0–0.1)
Basophils Relative: 1 %
Eosinophils Absolute: 0.1 10*3/uL (ref 0.0–0.7)
Eosinophils Relative: 1 %
HCT: 26.6 % — ABNORMAL LOW (ref 36.0–46.0)
Hemoglobin: 8.2 g/dL — ABNORMAL LOW (ref 12.0–15.0)
Lymphocytes Relative: 37 %
Lymphs Abs: 1.5 10*3/uL (ref 0.7–4.0)
MCH: 23.6 pg — ABNORMAL LOW (ref 26.0–34.0)
MCHC: 30.8 g/dL (ref 30.0–36.0)
MCV: 76.7 fL — ABNORMAL LOW (ref 78.0–100.0)
MONO ABS: 0.4 10*3/uL (ref 0.1–1.0)
MONOS PCT: 10 %
Neutro Abs: 2 10*3/uL (ref 1.7–7.7)
Neutrophils Relative %: 51 %
Platelets: 260 10*3/uL (ref 150–400)
RBC: 3.47 MIL/uL — ABNORMAL LOW (ref 3.87–5.11)
RDW: 17.1 % — ABNORMAL HIGH (ref 11.5–15.5)
WBC: 3.9 10*3/uL — ABNORMAL LOW (ref 4.0–10.5)

## 2018-03-17 LAB — URINALYSIS, ROUTINE W REFLEX MICROSCOPIC
Bilirubin Urine: NEGATIVE
GLUCOSE, UA: NEGATIVE mg/dL
HGB URINE DIPSTICK: NEGATIVE
KETONES UR: NEGATIVE mg/dL
Leukocytes, UA: NEGATIVE
Nitrite: NEGATIVE
PH: 5 (ref 5.0–8.0)
PROTEIN: NEGATIVE mg/dL
Specific Gravity, Urine: 1.019 (ref 1.005–1.030)

## 2018-03-17 LAB — PREGNANCY, URINE: Preg Test, Ur: NEGATIVE

## 2018-03-17 LAB — TROPONIN I: Troponin I: <0.03 ng/mL (ref <0.03)

## 2018-03-17 MED ORDER — OXYCODONE-ACETAMINOPHEN 5-325 MG PO TABS
2.0000 | ORAL_TABLET | Freq: Once | ORAL | Status: AC
  Administered 2018-03-17: 2 via ORAL
  Filled 2018-03-17: qty 2

## 2018-03-17 MED ORDER — LISINOPRIL 10 MG PO TABS
20.0000 mg | ORAL_TABLET | Freq: Once | ORAL | Status: AC
  Administered 2018-03-17: 20 mg via ORAL
  Filled 2018-03-17: qty 2

## 2018-03-17 MED ORDER — PENICILLIN V POTASSIUM 250 MG PO TABS: 250.0000 mg | ORAL_TABLET | Freq: Four times a day (QID) | ORAL | 0 refills | Status: DC

## 2018-03-17 MED ORDER — LISINOPRIL 20 MG PO TABS: 20.0000 mg | ORAL_TABLET | Freq: Every day | ORAL | 0 refills | Status: DC

## 2018-03-17 NOTE — ED Triage Notes (Signed)
Pt was at a clinic today to get her tooth fixed, but was unable to due to hypertension. Per clinic it was 193/105. Pt is alert and oriented. No dizziness. Pt was on BP meds, but quit taking them 1 year ago.

## 2018-03-17 NOTE — Discharge Instructions (Signed)
Take the prescription as directed.  Follow the diet and lifestyle suggestions given to you on the handouts today. Call your regular medical doctor today to schedule a follow up appointment within the next 3 days.  Return to the Emergency Department immediately sooner if worsening.

## 2018-03-17 NOTE — ED Provider Notes (Signed)
[facility] EMERGENCY DEPARTMENT Provider Note   CSN: [id number] Arrival date & time: 03/17/18  1242     History   Chief Complaint Chief Complaint  Patient presents with  . Hypertension    HPI Mariah Holloway is a 47 y.o. female.  HPI  Pt was seen at 1250. Per pt, c/o unknown onset and persistence of constant "high blood pressure" that was found today during a routine dental visit. Pt states she has not been taking her HTN meds for 1 year. Pt cannot recall what the name of her BP medication was. Denies any other complaint, other than acute on chronic right lower tooth "pain" of which she was "trying to get pulled" at the dentist today. Denies fevers, no intra-oral edema, no rash, no facial swelling, no dysphagia, no neck pain, no CP/palpitations, no SOB/cough, no abd pain, no N/V/D, no visual changes, no focal motor weakness, no tingling/numbness in extremities, no ataxia, no slurred speech, no facial droop.        Past Medical History:  Diagnosis Date  . Anemia   . Back pain   . Hypertension   . Lumbar radiculopathy   . Non compliance w medication regimen     There are no active problems to display for this patient.   Past Surgical History:  Procedure Laterality Date  . BACK SURGERY    . BACK SURGERY    . BREAST SURGERY    . TUBAL LIGATION       OB History    Gravida      Para      Term      Preterm      AB      Living  2     SAB      TAB      Ectopic      Multiple      Live Births               Home Medications    Prior to Admission medications   Medication Sig Start Date End Date Taking? Authorizing Provider  acetaminophen (TYLENOL) 500 MG tablet Take 500-1,000 mg by mouth every 6 (six) hours as needed for mild pain, moderate pain or headache.    [provider]    Family History Family History  Problem Relation Age of Onset  . Diabetes Mother   . Hypertension Father     Social History Social History   Tobacco Use   . Smoking status: Never Smoker  . Smokeless tobacco: Never Used  Substance Use Topics  . Alcohol use: No  . Drug use: No     Allergies   Sulfa antibiotics   Review of Systems Review of Systems ROS: Statement: All systems negative except as marked or noted in the HPI; Constitutional: Negative for fever and chills. ; ; Eyes: Negative for eye pain and discharge. ; ; ENMT: Positive for dental caries, dental hygiene poor and toothache. Negative for ear pain, bleeding gums, dental injury, facial deformity, facial swelling, hoarseness, nasal congestion, sinus pressure, sore throat, throat swelling and tongue swollen. ; ;  Cardiovascular: Negative for chest pain, palpitations, diaphoresis, dyspnea and peripheral edema. ; ; Respiratory: Negative for cough, wheezing and stridor. ; ; Gastrointestinal: Negative for nausea, vomiting, diarrhea, abdominal pain, blood in stool, hematemesis, jaundice and rectal bleeding. . ; ; Genitourinary: Negative for dysuria, flank pain and hematuria. ; ; Musculoskeletal: Negative for back pain and neck pain. Negative for swelling and trauma.; ; Skin:  Negative for pruritus, rash, abrasions, blisters, bruising and skin lesion.; ; Neuro: Negative for headache, lightheadedness and neck stiffness. Negative for weakness, altered level of consciousness, altered mental status, extremity weakness, paresthesias, involuntary movement, seizure and syncope.       Physical Exam Updated Vital Signs BP (!) 223/80 (BP Location: Right Arm)   Pulse (!) 58   Temp 98 F (36.7 C) (Oral)   Resp 16   Ht 5\' 4"  (1.626 m)   Wt 115.2 kg (254 lb)   LMP 03/07/2018   SpO2 97%   BMI 43.60 kg/m    BP (!) 182/89   Pulse 64   Temp 98 F (36.7 C) (Oral)   Resp 18   Ht 5\' 4"  (1.626 m)   Wt 115.2 kg (254 lb)   LMP 03/07/2018   SpO2 100%   BMI 43.60 kg/m    Physical Exam 1255: Physical examination: Vital signs and O2 SAT: Reviewed; Constitutional: Well developed, Well nourished, Well  hydrated, In no acute distress; Head and Face: Normocephalic, Atraumatic; Eyes: EOMI, PERRL, No scleral icterus; ENMT: Mouth and pharynx normal, Poor dentition, Widespread dental decay, Left TM normal, Right TM normal, Mucous membranes moist, +lower right 2nd premolar with dental decay. No gingival erythema, edema, fluctuance, or drainage.  No intra-oral edema. No submandibular or sublingual edema. No hoarse voice, no drooling, no stridor. No trismus. ;  Neck: Supple, Full range of motion, No lymphadenopathy; Cardiovascular: Regular rate and rhythm, No gallop; Respiratory: Breath sounds clear & equal bilaterally, No wheezes.  Speaking full sentences with ease, Normal respiratory effort/excursion; Chest: Nontender, Movement normal; Abdomen: Soft, Nontender, Nondistended, Normal bowel sounds; Genitourinary: No CVA tenderness; Extremities: Peripheral pulses normal, No tenderness, No edema, No calf edema or asymmetry.; Neuro: AA&Ox3, Major CN grossly intact. No facial droop. Speech clear. No gross focal motor or sensory deficits in extremities. Climbs on and off stretcher easily by herself. Gait steady..; Skin: Color normal, Warm, Dry.   ED Treatments / Results  Labs (all labs ordered are listed, but only abnormal results are displayed)   EKG None  Radiology   Procedures Procedures (including critical care time)  Medications Ordered in ED Medications  oxyCODONE-acetaminophen (PERCOCET/ROXICET) 5-325 MG per tablet 2 tablet (2 tablets Oral Given 03/17/18 1321)     Initial Impression / Assessment and Plan / ED Course  I have reviewed the triage vital signs and the nursing notes.  Pertinent labs & imaging results that were available during my care of the patient were reviewed by me and considered in my medical decision making (see chart for details).  MDM Reviewed: previous chart, nursing note and vitals Reviewed previous: labs and ECG Interpretation: labs, ECG, x-ray and CT scan   ED ECG  REPORT   Date: 03/17/2018  Rate: 55  Rhythm: normal sinus rhythm  QRS Axis: normal  Intervals: normal  ST/T Wave abnormalities: normal  Conduction Disutrbances:nonspecific intraventricular conduction delay  Narrative Interpretation:   Old EKG Reviewed: unchanged; no significant changes compared to previous EKG dated 12/16/2014. I have personally reviewed the EKG tracing and agree with the computerized printout as noted.    Results for orders placed or performed during the hospital encounter of 03/17/18  Urinalysis, Routine w reflex microscopic  Result Value Ref Range   Color, Urine YELLOW YELLOW   APPearance HAZY (A) CLEAR   Specific Gravity, Urine 1.019 1.005 - 1.030   pH 5.0 5.0 - 8.0   Glucose, UA NEGATIVE NEGATIVE mg/dL   Hgb urine dipstick NEGATIVE  NEGATIVE   Bilirubin Urine NEGATIVE NEGATIVE   Ketones, ur NEGATIVE NEGATIVE mg/dL   Protein, ur NEGATIVE NEGATIVE mg/dL   Nitrite NEGATIVE NEGATIVE   Leukocytes, UA NEGATIVE NEGATIVE  Pregnancy, urine  Result Value Ref Range   Preg Test, Ur NEGATIVE NEGATIVE  Comprehensive metabolic panel  Result Value Ref Range   Sodium 140 135 - 145 mmol/L   Potassium 3.7 3.5 - 5.1 mmol/L   Chloride 109 98 - 111 mmol/L   CO2 25 22 - 32 mmol/L   Glucose, Bld 91 70 - 99 mg/dL   BUN 9 6 - 20 mg/dL   Creatinine, Ser 0.66 0.44 - 1.00 mg/dL   Calcium 8.6 (L) 8.9 - 10.3 mg/dL   Total Protein 7.5 6.5 - 8.1 g/dL   Albumin 3.5 3.5 - 5.0 g/dL   AST 18 15 - 41 U/L   ALT 15 0 - 44 U/L   Alkaline Phosphatase 56 38 - 126 U/L   Total Bilirubin 0.6 0.3 - 1.2 mg/dL   GFR calc non Af Amer >60 >60 mL/min   GFR calc Af Amer >60 >60 mL/min   Anion gap 6 5 - 15  Troponin I  Result Value Ref Range   Troponin I <0.03 <0.03 ng/mL  CBC with Differential  Result Value Ref Range   WBC 3.9 (L) 4.0 - 10.5 K/uL   RBC 3.47 (L) 3.87 - 5.11 MIL/uL   Hemoglobin 8.2 (L) 12.0 - 15.0 g/dL   HCT 26.6 (L) 36.0 - 46.0 %   MCV 76.7 (L) 78.0 - 100.0 fL   MCH 23.6  (L) 26.0 - 34.0 pg   MCHC 30.8 30.0 - 36.0 g/dL   RDW 17.1 (H) 11.5 - 15.5 %   Platelets 260 150 - 400 K/uL   Neutrophils Relative % 51 %   Neutro Abs 2.0 1.7 - 7.7 K/uL   Lymphocytes Relative 37 %   Lymphs Abs 1.5 0.7 - 4.0 K/uL   Monocytes Relative 10 %   Monocytes Absolute 0.4 0.1 - 1.0 K/uL   Eosinophils Relative 1 %   Eosinophils Absolute 0.1 0.0 - 0.7 K/uL   Basophils Relative 1 %   Basophils Absolute 0.0 0.0 - 0.1 K/uL   Dg Chest 2 View Result Date: 03/17/2018 CLINICAL DATA:  History of hypertension, nonsmoker. EXAM: CHEST - 2 VIEW COMPARISON:  Chest x-ray of December 16, 2014 FINDINGS: The lungs are adequately inflated and clear. The heart and pulmonary vascularity are normal. The mediastinum is normal in width. There is no pleural effusion. There is mild multilevel degenerative disc disease of the thoracic spine. IMPRESSION: There is no acute cardiopulmonary abnormality. Electronically Signed   By: David  Martinique M.D.   On: 03/17/2018 13:34   Ct Head Wo Contrast Result Date: 03/17/2018 CLINICAL DATA:  Hypertension EXAM: CT HEAD WITHOUT CONTRAST TECHNIQUE: Contiguous axial images were obtained from the base of the skull through the vertex without intravenous contrast. COMPARISON:  None. FINDINGS: Brain: No evidence of acute infarction, hemorrhage, hydrocephalus, extra-axial collection or mass lesion/mass effect. Vascular: Negative for hyperdense vessel Skull: Negative Sinuses/Orbits: Negative Other: None IMPRESSION: Negative CT head Electronically Signed   By: Franchot Gallo M.D.   On: 03/17/2018 13:31    Results for AYASHA, ELLINGSEN (MRN [id number]) as of 03/17/2018 13:43  Ref. Range 05/15/2014 18:28 05/30/2014 15:35 12/16/2014 16:15 06/30/2017 12:21 03/17/2018 13:07  Hemoglobin Latest Ref Range: 12.0 - 15.0 g/dL 10.5 (L) 10.9 (L) 7.4 (L) 10.0 (L) 8.2 (L)  HCT Latest  Ref Range: 36.0 - 46.0 % 32.1 (L) 34.3 (L) 24.1 (L) 31.4 (L) 26.6 (L)    1525:  H/H per baseline. Pt's previous med  verified at her last pharmacy, and dose given in ED. Meds given for dental pain. Pt has tol PO well without N/V. Pt has ambulated with steady gait, easy resps, NAD. Continues to deny any complaints other than her chronic tooth pain. No clear indication for admission at this time. Strongly encouraged to take her meds, f/u with PMD for good continuity of care and control of her chronic medical conditions. Pt verb understanding, states she is ready to go home now. Dx and testing d/w pt.  Questions answered.  Verb understanding, agreeable to d/c home with outpt f/u.    Final Clinical Impressions(s) / ED Diagnoses   Final diagnoses:  None    ED Discharge Orders    None       Francine Graven, DO 03/21/18 4621

## 2018-03-17 NOTE — ED Notes (Signed)
Patient transported to X-ray 

## 2018-05-28 ENCOUNTER — Other Ambulatory Visit: Payer: Self-pay

## 2018-05-28 ENCOUNTER — Encounter (HOSPITAL_COMMUNITY): Payer: Self-pay | Admitting: Emergency Medicine

## 2018-05-28 ENCOUNTER — Emergency Department (HOSPITAL_COMMUNITY)
Admission: EM | Admit: 2018-05-28 | Discharge: 2018-05-28 | Disposition: A | Discharge status: Home / Self Care | Payer: Self-pay | Attending: Emergency Medicine | Admitting: Emergency Medicine

## 2018-05-28 DIAGNOSIS — I10 Essential (primary) hypertension: Secondary | ICD-10-CM | POA: Insufficient documentation

## 2018-05-28 DIAGNOSIS — K0889 Other specified disorders of teeth and supporting structures: Secondary | ICD-10-CM | POA: Insufficient documentation

## 2018-05-28 DIAGNOSIS — Z79899 Other long term (current) drug therapy: Secondary | ICD-10-CM | POA: Insufficient documentation

## 2018-05-28 MED ORDER — HYDROCODONE-ACETAMINOPHEN 5-325 MG PO TABS
2.0000 | ORAL_TABLET | Freq: Once | ORAL | Status: AC
  Administered 2018-05-28: 2 via ORAL
  Filled 2018-05-28: qty 2

## 2018-05-28 MED ORDER — HYDROCODONE-ACETAMINOPHEN 5-325 MG PO TABS: 2.0000 | ORAL_TABLET | ORAL | 0 refills | Status: DC | PRN

## 2018-05-28 MED ORDER — AMOXICILLIN 250 MG PO CAPS
500.0000 mg | ORAL_CAPSULE | Freq: Once | ORAL | Status: AC
  Administered 2018-05-28: 500 mg via ORAL
  Filled 2018-05-28: qty 2

## 2018-05-28 MED ORDER — AMOXICILLIN 500 MG PO CAPS: 500.0000 mg | ORAL_CAPSULE | Freq: Three times a day (TID) | ORAL | 0 refills | Status: DC

## 2018-05-28 NOTE — ED Notes (Signed)
Pt was seen living driving after taking narcotics for pain. Pt reported that she had a ride waiting. PD notified.

## 2018-05-28 NOTE — ED Triage Notes (Addendum)
Patient complaining of right lower dental pain starting yesterday. Patient's B/P 190/81, states she is out of her blood pressure medications x 2 days.

## 2018-05-28 NOTE — ED Provider Notes (Signed)
[facility] EMERGENCY DEPARTMENT Provider Note   CSN: [id number] Arrival date & time: 05/28/18  1010     History   Chief Complaint Chief Complaint  Patient presents with  . Dental Pain    HPI Mariah Holloway is a 47 y.o. female.  The history is provided by the patient. No language interpreter was used.  Dental Pain   This is a new problem. The problem occurs constantly. The problem has been gradually worsening. The pain is moderate. The treatment provided no relief.  Pt complains of a severe toothache today.   Pt is suppose to follow up with dentist in [place].   Past Medical History:  Diagnosis Date  . Anemia   . Back pain   . Hypertension   . Lumbar radiculopathy   . Non compliance w medication regimen     There are no active problems to display for this patient.   Past Surgical History:  Procedure Laterality Date  . BACK SURGERY    . BACK SURGERY    . BREAST SURGERY    . TUBAL LIGATION       OB History    Gravida      Para      Term      Preterm      AB      Living  2     SAB      TAB      Ectopic      Multiple      Live Births               Home Medications    Prior to Admission medications   Medication Sig Start Date End Date Taking? Authorizing Provider  acetaminophen (TYLENOL) 500 MG tablet Take 500-1,000 mg by mouth every 6 (six) hours as needed for mild pain, moderate pain or headache.   Yes [provider]  lisinopril (PRINIVIL,ZESTRIL) 20 MG tablet Take 1 tablet (20 mg total) by mouth daily. 03/17/18   Francine Graven, DO    Family History Family History  Problem Relation Age of Onset  . Diabetes Mother   . Hypertension Father     Social History Social History   Tobacco Use  . Smoking status: Never Smoker  . Smokeless tobacco: Never Used  Substance Use Topics  . Alcohol use: No  . Drug use: No     Allergies   Sulfa antibiotics   Review of Systems Review of Systems  All other systems  reviewed and are negative.    Physical Exam Updated Vital Signs BP (!) 190/81 (BP Location: Right Arm)   Pulse 74   Temp 98.4 F (36.9 C) (Oral)   Resp 20   Ht 5\' 4"  (1.626 m)   Wt 113.4 kg   LMP 05/16/2018   SpO2 100%   BMI 42.91 kg/m   Physical Exam  Constitutional: She appears well-developed and well-nourished.  HENT:  Head: Normocephalic and atraumatic.  Mouth/Throat: Oropharynx is clear and moist.  Eyes: Pupils are equal, round, and reactive to light.  Cardiovascular: Normal rate.  Pulmonary/Chest: Effort normal.  Abdominal: Soft.  Neurological: She is alert.  Skin: Skin is warm.  Nursing note and vitals reviewed.    ED Treatments / Results  Labs (all labs ordered are listed, but only abnormal results are displayed) Labs Reviewed - No data to display  EKG None  Radiology No results found.  Procedures Procedures (including critical care time)  Medications Ordered in ED Medications  HYDROcodone-acetaminophen (NORCO/VICODIN) 5-325 MG per tablet 2 tablet (has no administration in time range)     Initial Impression / Assessment and Plan / ED Course  I have reviewed the triage vital signs and the nursing notes.  Pertinent labs & imaging results that were available during my care of the patient were reviewed by me and considered in my medical decision making (see chart for details).     Pt given 2 hydrocodone here.  I will start pt on amoxicillin.  Pt given rx for 10 hydrocodone tablets.  Pt advised she needs to see dentist as soon as possible  Final Clinical Impressions(s) / ED Diagnoses   Final diagnoses:  Pain, dental    ED Discharge Orders    None    An After Visit Summary was printed and given to the patient.    Fransico Meadow, PA-C 05/28/18 [place], MD 05/28/18 [phone]

## 2018-05-28 NOTE — Discharge Instructions (Addendum)
See the Dentist to be seen as soon as possible

## 2018-06-10 LAB — GLUCOSE, POCT (MANUAL RESULT ENTRY): POC GLUCOSE: 136 mg/dL — AB (ref 70–99)

## 2019-09-15 ENCOUNTER — Other Ambulatory Visit: Payer: Self-pay

## 2019-09-15 ENCOUNTER — Emergency Department (HOSPITAL_COMMUNITY)
Admission: EM | Admit: 2019-09-15 | Discharge: 2019-09-15 | Disposition: A | Discharge status: Home / Self Care | Payer: Self-pay | Attending: Emergency Medicine | Admitting: Emergency Medicine

## 2019-09-15 ENCOUNTER — Encounter (HOSPITAL_COMMUNITY): Payer: Self-pay | Admitting: Emergency Medicine

## 2019-09-15 DIAGNOSIS — M79671 Pain in right foot: Secondary | ICD-10-CM | POA: Insufficient documentation

## 2019-09-15 DIAGNOSIS — I10 Essential (primary) hypertension: Secondary | ICD-10-CM | POA: Insufficient documentation

## 2019-09-15 DIAGNOSIS — Z79899 Other long term (current) drug therapy: Secondary | ICD-10-CM | POA: Insufficient documentation

## 2019-09-15 MED ORDER — NAPROXEN 250 MG PO TABS
500.0000 mg | ORAL_TABLET | Freq: Once | ORAL | Status: DC
Start: 2019-09-15 — End: 2019-09-15

## 2019-09-15 MED ORDER — NAPROXEN 500 MG PO TABS: 500.0000 mg | ORAL_TABLET | Freq: Two times a day (BID) | ORAL | 0 refills | Status: DC

## 2019-09-15 MED ORDER — IBUPROFEN 800 MG PO TABS
800.0000 mg | ORAL_TABLET | Freq: Once | ORAL | Status: AC
  Administered 2019-09-15: 09:00:00 800 mg via ORAL
  Filled 2019-09-15: qty 1

## 2019-09-15 NOTE — Discharge Instructions (Signed)
Take naproxen 2 times a day with meals.  Do not take other anti-inflammatories at the same time (Advil, Motrin, ibuprofen, Aleve). You may supplement with Tylenol if you need further pain control. Rest your foot on ice pack, 20 minutes at a time, 3 times a day. Use the cam walker when walking to help support her foot and help with pain control. If your pain is not improving within the next week, follow-up with orthopedic doctor. Return to the emergency room with any new, worsening, concerning symptoms.

## 2019-09-15 NOTE — ED Triage Notes (Signed)
Pt tripped and fell 2 days ago. C/o of right foot pain with movement

## 2019-09-15 NOTE — ED Provider Notes (Signed)
[facility] EMERGENCY DEPARTMENT Provider Note   CSN: [id number] Arrival date & time: 09/15/19  [id number]     History Chief Complaint  Patient presents with  . Foot Pain    Mariah Holloway is a 49 y.o. female presenting for evaluation of R heel pain.   Pt states 3 days ago she twisted her right foot.  Since then, she has had gradually worsening right heel pain.  Pain is over her Achilles tendon.  It is constant, worse with movement and weightbearing.  She describes it as a sharp shooting pain.  She has not taken anything for this including Tylenol or ibuprofen.   It does not radiate.  She denies numbness or tingling.  She denies pain elsewhere.  She denies pain in her foot or toes.  HPI     Past Medical History:  Diagnosis Date  . Anemia   . Back pain   . Hypertension   . Lumbar radiculopathy   . Non compliance w medication regimen     There are no problems to display for this patient.   Past Surgical History:  Procedure Laterality Date  . BACK SURGERY    . BACK SURGERY    . BREAST SURGERY    . TUBAL LIGATION       OB History    Gravida      Para      Term      Preterm      AB      Living  2     SAB      TAB      Ectopic      Multiple      Live Births              Family History  Problem Relation Age of Onset  . Diabetes Mother   . Hypertension Father     Social History   Tobacco Use  . Smoking status: Never Smoker  . Smokeless tobacco: Never Used  Substance Use Topics  . Alcohol use: No  . Drug use: No    Home Medications Prior to Admission medications   Medication Sig Start Date End Date Taking? Authorizing Provider  acetaminophen (TYLENOL) 500 MG tablet Take 500-1,000 mg by mouth every 6 (six) hours as needed for mild pain, moderate pain or headache.    [provider]  amoxicillin (AMOXIL) 500 MG capsule Take 1 capsule (500 mg total) by mouth 3 (three) times daily. 05/28/18   Fransico Meadow, PA-C    HYDROcodone-acetaminophen (NORCO/VICODIN) 5-325 MG tablet Take 2 tablets by mouth every 4 (four) hours as needed. 05/28/18   Fransico Meadow, PA-C  lisinopril (PRINIVIL,ZESTRIL) 20 MG tablet Take 1 tablet (20 mg total) by mouth daily. 03/17/18   Francine Graven, DO  naproxen (NAPROSYN) 500 MG tablet Take 1 tablet (500 mg total) by mouth 2 (two) times daily with a meal. 09/15/19   Eri Platten, PA-C    Allergies    Sulfa antibiotics  Review of Systems   Review of Systems  Musculoskeletal: Positive for arthralgias.  Neurological: Negative for numbness.    Physical Exam Updated Vital Signs BP (!) 156/79   Pulse 65   Temp 98.5 F (36.9 C)   Resp 16   Wt 113.4 kg   SpO2 100%   BMI 42.91 kg/m   Physical Exam Vitals and nursing note reviewed.  Constitutional:      General: She is not in acute distress.    Appearance:  She is well-developed.  HENT:     Head: Normocephalic and atraumatic.  Pulmonary:     Effort: Pulmonary effort is normal.  Abdominal:     General: There is no distension.  Musculoskeletal:        General: Tenderness present. Normal range of motion.     Cervical back: Normal range of motion.     Comments: No obvious swelling or deformity. Tenderness palpation over the right Achilles tendon.  Tendon is palpable and intact.  Pedal pulses 2+ bilaterally. Patient is ambulatory with pain.  No tenderness palpation of the foot or toes.  Full active range of motion of the toes without difficulty.  Limited range of motion of the ankle due to pain.  No tenderness to palpation of the calf or distal leg.  Skin:    General: Skin is warm.     Capillary Refill: Capillary refill takes less than 2 seconds.     Findings: No rash.  Neurological:     Mental Status: She is alert and oriented to person, place, and time.     ED Results / Procedures / Treatments   Labs (all labs ordered are listed, but only abnormal results are displayed) Labs Reviewed - No data to  display  EKG None  Radiology No results found.  Procedures Procedures (including critical care time)  Medications Ordered in ED Medications  naproxen (NAPROSYN) tablet 500 mg (has no administration in time range)    ED Course  I have reviewed the triage vital signs and the nursing notes.  Pertinent labs & imaging results that were available during my care of the patient were reviewed by me and considered in my medical decision making (see chart for details).    MDM Rules/Calculators/A&P                      Patient presenting for evaluation of right foot pain.  Physical exam reassuring, she appears nontoxic.  Pain is focally over the Achilles tendon.  On exam, tenderness still intact.  Likely sprain/tendinitis.  As she has no bony pain, and I have low suspicion for fracture dislocation, I do not believe x-rays are being beneficial today.  Discussed symptomatic treatment with NSAIDs and CAM Walker, rest, ice, elevation.  Encouraged follow-up with orthopedics if symptoms not improving.  At this time, patient appears safe for discharge.  Return precautions given.  Patient states she understands and agrees to plan.  Final Clinical Impression(s) / ED Diagnoses Final diagnoses:  Right foot pain    Rx / DC Orders ED Discharge Orders         Ordered    naproxen (NAPROSYN) 500 MG tablet  2 times daily with meals     09/15/19 0758           Franchot Heidelberg, PA-C 09/15/19 [id number]    Milton Ferguson, MD 09/15/19 [phone]

## 2019-12-18 ENCOUNTER — Emergency Department (HOSPITAL_COMMUNITY)
Admission: EM | Admit: 2019-12-18 | Discharge: 2019-12-18 | Disposition: A | Discharge status: Home / Self Care | Payer: Self-pay | Attending: Emergency Medicine | Admitting: Emergency Medicine

## 2019-12-18 ENCOUNTER — Encounter (HOSPITAL_COMMUNITY): Payer: Self-pay | Admitting: *Deleted

## 2019-12-18 ENCOUNTER — Emergency Department (HOSPITAL_COMMUNITY): Payer: Self-pay

## 2019-12-18 ENCOUNTER — Other Ambulatory Visit: Payer: Self-pay

## 2019-12-18 DIAGNOSIS — I10 Essential (primary) hypertension: Secondary | ICD-10-CM | POA: Insufficient documentation

## 2019-12-18 DIAGNOSIS — Z79899 Other long term (current) drug therapy: Secondary | ICD-10-CM | POA: Insufficient documentation

## 2019-12-18 DIAGNOSIS — Y929 Unspecified place or not applicable: Secondary | ICD-10-CM | POA: Insufficient documentation

## 2019-12-18 DIAGNOSIS — Y9389 Activity, other specified: Secondary | ICD-10-CM | POA: Insufficient documentation

## 2019-12-18 DIAGNOSIS — Y999 Unspecified external cause status: Secondary | ICD-10-CM | POA: Insufficient documentation

## 2019-12-18 DIAGNOSIS — S2231XA Fracture of one rib, right side, initial encounter for closed fracture: Secondary | ICD-10-CM | POA: Insufficient documentation

## 2019-12-18 MED ORDER — ONDANSETRON 8 MG PO TBDP
8.0000 mg | ORAL_TABLET | Freq: Once | ORAL | Status: AC
  Administered 2019-12-18: 8 mg via ORAL
  Filled 2019-12-18: qty 1

## 2019-12-18 MED ORDER — ONDANSETRON HCL 4 MG PO TABS: 4.0000 mg | ORAL_TABLET | Freq: Three times a day (TID) | ORAL | 0 refills | Status: DC | PRN

## 2019-12-18 MED ORDER — OXYCODONE-ACETAMINOPHEN 5-325 MG PO TABS: 1.0000 | ORAL_TABLET | ORAL | 0 refills | Status: DC | PRN

## 2019-12-18 MED ORDER — OXYCODONE-ACETAMINOPHEN 5-325 MG PO TABS
1.0000 | ORAL_TABLET | Freq: Once | ORAL | Status: AC
  Administered 2019-12-18: 1 via ORAL
  Filled 2019-12-18: qty 1
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
## 2019-12-18 NOTE — ED Notes (Signed)
Call to Herbie [place], RT   He will come down to teach incentive spir
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
## 2019-12-18 NOTE — Discharge Instructions (Addendum)
Take Ibuprofen or tylenol for mild-moderate pain Take Percocet for severe pain every 4 hours as needed. This medicine makes you sleepy so do not take before driving or while working Use a device called incentive spirometer to practice deep breathing several times a day to prevent pneumonia Return to the ER for worsening symptoms
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
## 2020-07-05 ENCOUNTER — Other Ambulatory Visit: Payer: Self-pay

## 2020-07-05 ENCOUNTER — Encounter (HOSPITAL_COMMUNITY): Payer: Self-pay

## 2020-07-05 ENCOUNTER — Emergency Department (HOSPITAL_COMMUNITY): Payer: PRIVATE HEALTH INSURANCE

## 2020-07-05 ENCOUNTER — Emergency Department (HOSPITAL_COMMUNITY)
Admission: EM | Admit: 2020-07-05 | Discharge: 2020-07-05 | Disposition: A | Discharge status: Home / Self Care | Payer: PRIVATE HEALTH INSURANCE | Attending: Emergency Medicine | Admitting: Emergency Medicine

## 2020-07-05 DIAGNOSIS — R0789 Other chest pain: Secondary | ICD-10-CM | POA: Diagnosis present

## 2020-07-05 DIAGNOSIS — M545 Low back pain, unspecified: Secondary | ICD-10-CM | POA: Diagnosis not present

## 2020-07-05 DIAGNOSIS — R079 Chest pain, unspecified: Secondary | ICD-10-CM

## 2020-07-05 DIAGNOSIS — G8929 Other chronic pain: Secondary | ICD-10-CM | POA: Diagnosis not present

## 2020-07-05 DIAGNOSIS — I10 Essential (primary) hypertension: Secondary | ICD-10-CM | POA: Insufficient documentation

## 2020-07-05 DIAGNOSIS — Z79899 Other long term (current) drug therapy: Secondary | ICD-10-CM | POA: Diagnosis not present

## 2020-07-05 LAB — URINALYSIS, ROUTINE W REFLEX MICROSCOPIC
Bacteria, UA: NONE SEEN
Bilirubin Urine: NEGATIVE
Glucose, UA: NEGATIVE mg/dL
Ketones, ur: NEGATIVE mg/dL
Nitrite: NEGATIVE
Protein, ur: NEGATIVE mg/dL
Specific Gravity, Urine: 1.016 (ref 1.005–1.030)
pH: 5 (ref 5.0–8.0)

## 2020-07-05 LAB — BASIC METABOLIC PANEL
Anion gap: 9 (ref 5–15)
BUN: 19 mg/dL (ref 6–20)
CO2: 20 mmol/L — ABNORMAL LOW (ref 22–32)
Calcium: 8.6 mg/dL — ABNORMAL LOW (ref 8.9–10.3)
Chloride: 105 mmol/L (ref 98–111)
Creatinine, Ser: 0.77 mg/dL (ref 0.44–1.00)
GFR, Estimated: >60 mL/min (ref >60)
Glucose, Bld: 110 mg/dL — ABNORMAL HIGH (ref 70–99)
Potassium: 3.6 mmol/L (ref 3.5–5.1)
Sodium: 134 mmol/L — ABNORMAL LOW (ref 135–145)

## 2020-07-05 LAB — CBC
HCT: 32.6 % — ABNORMAL LOW (ref 36.0–46.0)
Hemoglobin: 10.1 g/dL — ABNORMAL LOW (ref 12.0–15.0)
MCH: 27.2 pg (ref 26.0–34.0)
MCHC: 31 g/dL (ref 30.0–36.0)
MCV: 87.6 fL (ref 80.0–100.0)
Platelets: 219 10*3/uL (ref 150–400)
RBC: 3.72 MIL/uL — ABNORMAL LOW (ref 3.87–5.11)
RDW: 17.2 % — ABNORMAL HIGH (ref 11.5–15.5)
WBC: 4.7 10*3/uL (ref 4.0–10.5)
nRBC: 0 % (ref 0.0–0.2)

## 2020-07-05 LAB — POC URINE PREG, ED: Preg Test, Ur: NEGATIVE

## 2020-07-05 LAB — TROPONIN I (HIGH SENSITIVITY)
Troponin I (High Sensitivity): <2 ng/L (ref <18)
Troponin I (High Sensitivity): <2 ng/L (ref <18)

## 2020-07-05 MED ORDER — ONDANSETRON HCL 4 MG/2ML IJ SOLN
4.0000 mg | Freq: Once | INTRAMUSCULAR | Status: AC
  Administered 2020-07-05: 4 mg via INTRAVENOUS
  Filled 2020-07-05: qty 2

## 2020-07-05 MED ORDER — KETOROLAC TROMETHAMINE 30 MG/ML IJ SOLN
15.0000 mg | Freq: Once | INTRAMUSCULAR | Status: AC
  Administered 2020-07-05: 15 mg via INTRAVENOUS
  Filled 2020-07-05: qty 1

## 2020-07-05 MED ORDER — DIAZEPAM 5 MG/ML IJ SOLN
2.5000 mg | Freq: Once | INTRAMUSCULAR | Status: AC
  Administered 2020-07-05: 2.5 mg via INTRAVENOUS
  Filled 2020-07-05: qty 2

## 2020-07-05 MED ORDER — METHOCARBAMOL 500 MG PO TABS: 500.0000 mg | ORAL_TABLET | Freq: Three times a day (TID) | ORAL | 0 refills | Status: DC | PRN

## 2020-07-05 MED ORDER — ASPIRIN 81 MG PO CHEW
324.0000 mg | CHEWABLE_TABLET | Freq: Once | ORAL | Status: AC
  Administered 2020-07-05: 324 mg via ORAL
  Filled 2020-07-05: qty 4

## 2020-07-05 NOTE — Discharge Instructions (Addendum)
Your exam, lab tests, ekg and xrays are reassuring today.  However, we would like you to see Dr Harl Bowie to consider having an exercise stress test as discussed. Call his office for an appointment for this.    You have been prescribed a muscle relaxer to help your low back pain. A heating pad or warm tub soak  several times daily may also help with your back pain symptoms. You may take your ibuprofen and tylenol as discussed.
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
## 2020-07-05 NOTE — ED Provider Notes (Signed)
[facility] EMERGENCY DEPARTMENT Provider Note   CSN: [id number] Arrival date & time: 07/05/20  7824     History Chief Complaint  Patient presents with  . Chest Pain    Mariah Holloway is a 49 y.o. 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  She currently endorses a sharp stabbing pain in her right lower back region.  She has low back pain intermittently with known lumbar radiculopathy but this pain is sharper than normal.  She continues to have nausea.  She denies shortness of breath, palpitations, abdominal pain.  She has a history of rare panic episodes and has experienced chest pressure with these episodes but denies this pattern today.  No kidney stone history, no dysuria or hematuria.  No family history of premature cardiac illness.  HPI  HPI: A 49 year old patient with a history of hypertension and obesity presents for evaluation of chest pain. Initial onset of pain was approximately 1-3 hours ago. The patient's chest pain is described as heaviness/pressure/tightness and is not worse with exertion. The patient complains of nausea and reports some diaphoresis. The patient's chest pain is middle- or left-sided, is not well-localized, is not sharp and does not radiate to the arms/jaw/neck. The patient has no history of stroke, has no history of peripheral artery disease, has not smoked in the past 90 days, denies any history of treated diabetes, has no relevant family history of coronary artery disease (first degree relative at less than age 25) and has no history of hypercholesterolemia.   Past Medical History:  Diagnosis  Date  . Anemia   . Back pain   . Hypertension   . Lumbar radiculopathy   . Non compliance w medication regimen     There are no problems to display for this patient.   Past Surgical History:  Procedure Laterality Date  . BACK SURGERY    . BACK SURGERY    . BREAST SURGERY    . TUBAL LIGATION       OB History    Gravida      Para      Term      Preterm      AB      Living  2     SAB      TAB      Ectopic      Multiple      Live Births              Family History  Problem Relation Age of Onset  . Diabetes Mother   . Hypertension Father     Social History   Tobacco Use  . Smoking status: Never Smoker  . Smokeless tobacco: Never Used  Vaping Use  . Vaping Use: Never used  Substance Use Topics  . Alcohol use: Yes    Comment: occasional  . Drug use: No    Home Medications Prior to Admission medications   Medication Sig Start Date End Date Taking? Authorizing Provider  amLODipine (NORVASC) 10 MG tablet Take 10 mg by mouth daily. 06/27/20  Yes [provider]  ferrous sulfate 325 (65 FE) MG tablet Take 325 mg by  mouth daily with breakfast.   Yes [provider]  lisinopril (PRINIVIL,ZESTRIL) 20 MG tablet Take 1 tablet (20 mg total) by mouth daily. 03/17/18  Yes Francine Graven, DO  Vitamin D, Ergocalciferol, (DRISDOL) 1.25 MG (50000 UNIT) CAPS capsule Take 50,000 Units by mouth once a week. 05/28/20  Yes [provider]  amoxicillin (AMOXIL) 500 MG capsule Take 1 capsule (500 mg total) by mouth 3 (three) times daily. Patient not taking: Reported on 07/05/2020 05/28/18   Fransico Meadow, PA-C  HYDROcodone-acetaminophen (NORCO/VICODIN) 5-325 MG tablet Take 2 tablets by mouth every 4 (four) hours as needed. Patient not taking: Reported on 07/05/2020 05/28/18   Fransico Meadow, PA-C  methocarbamol (ROBAXIN) 500 MG tablet Take 1 tablet (500 mg total) by mouth every 8 (eight) hours as needed for muscle spasms. 07/05/20   Evalee Jefferson, PA-C  naproxen (NAPROSYN) 500 MG tablet Take 1 tablet (500 mg total) by mouth 2 (two) times daily with a meal. Patient not taking: Reported on 07/05/2020 09/15/19   Caccavale, Sophia, PA-C  ondansetron (ZOFRAN) 4 MG tablet Take 1 tablet (4 mg total) by mouth every 8 (eight) hours as needed for nausea or vomiting. Patient not taking: Reported on 07/05/2020 12/18/19   Recardo Evangelist, PA-C  oxyCODONE-acetaminophen (PERCOCET/ROXICET) 5-325 MG tablet Take 1 tablet by mouth every 4 (four) hours as needed for severe pain. Patient not taking: Reported on 07/05/2020 12/18/19   Recardo Evangelist, PA-C    Allergies    Sulfa antibiotics  Review of Systems   Review of Systems  Constitutional: Positive for diaphoresis. Negative for fever.  HENT: Negative for congestion and sore throat.   Eyes: Negative.   Respiratory: Negative for cough, shortness of breath and wheezing.   Cardiovascular: Positive for chest pain. Negative for palpitations.  Gastrointestinal: Positive for nausea. Negative for abdominal pain and vomiting.  Genitourinary: Negative.  Negative for dysuria.  Musculoskeletal: Positive for back pain. Negative for arthralgias, joint swelling and neck pain.  Skin: Negative.  Negative for rash and wound.  Neurological: Positive for dizziness. Negative for weakness, light-headedness, numbness and headaches.  Psychiatric/Behavioral: Negative.     Physical Exam Updated Vital Signs BP (!) 155/119 (BP Location: Left Arm)   Pulse 72   Temp 98.2 F (36.8 C) (Oral)   Resp 18   Ht 5\' 4"  (1.626 m)   LMP 06/23/2020   SpO2 100%   BMI 42.91 kg/m   Physical Exam Vitals and nursing note reviewed.  Constitutional:      Appearance: She is well-developed.  HENT:     Head: Normocephalic and atraumatic.  Eyes:     Conjunctiva/sclera: Conjunctivae normal.  Cardiovascular:     Rate and Rhythm: Normal rate and regular rhythm.     Pulses:          Radial pulses are 2+ on the right side and  2+ on the left side.       Dorsalis pedis pulses are 2+ on the right side and 2+ on the left side.     Heart sounds: Normal heart sounds.  Pulmonary:     Effort: Pulmonary effort is normal.     Breath sounds: Normal breath sounds. No wheezing, rhonchi or rales.  Abdominal:     General: Bowel sounds are normal.     Palpations: Abdomen is soft.     Tenderness: There is no abdominal tenderness.  Musculoskeletal:        General: Normal range of motion.  Cervical back: Normal range of motion.     Lumbar back: Tenderness present. No bony tenderness.       Back:     Right lower leg: No tenderness. No edema.     Left lower leg: No tenderness. No edema.     Comments: ttp right paralumbar region upper buttock. Reproducible with palpation.    Skin:    General: Skin is warm and dry.  Neurological:     General: No focal deficit present.     Mental Status: She is alert.     ED Results / Procedures / Treatments   Labs (all labs ordered are listed, but only abnormal results are displayed) Labs Reviewed  BASIC METABOLIC PANEL - Abnormal; Notable for the following components:      Result Value   Sodium 134 (*)    CO2 20 (*)    Glucose, Bld 110 (*)    Calcium 8.6 (*)    All other components within normal limits  CBC - Abnormal; Notable for the following components:   RBC 3.72 (*)    Hemoglobin 10.1 (*)    HCT 32.6 (*)    RDW 17.2 (*)    All other components within normal limits  URINALYSIS, ROUTINE W REFLEX MICROSCOPIC - Abnormal; Notable for the following components:   Hgb urine dipstick SMALL (*)    Leukocytes,Ua TRACE (*)    All other components within normal limits  POC URINE PREG, ED  TROPONIN I (HIGH SENSITIVITY)  TROPONIN I (HIGH SENSITIVITY)    EKG EKG Interpretation  Date/Time:  Thursday July 05 2020 10:13:53 EST Ventricular Rate:  74 PR Interval:    QRS Duration: 89 QT Interval:  393 QTC Calculation: 436 R Axis:   43 Text Interpretation: Sinus rhythm  Confirmed by Milton Ferguson [phone]) on 07/05/2020 3:32:35 PM   Radiology DG Chest 2 View  Result Date: 07/05/2020 CLINICAL DATA:  Chest pain EXAM: CHEST - 2 VIEW COMPARISON:  Chest radiograph March 17, 2018 FINDINGS: The heart size and mediastinal contours are within normal limits. Both lungs are clear. No visible pneumothorax or pleural effusion. Thoracolumbar spondylosis. IMPRESSION: No active cardiopulmonary disease. Electronically Signed   By: Dahlia Bailiff MD   On: 07/05/2020 10:54    Procedures Procedures (including critical care time)  Medications Ordered in ED Medications  aspirin chewable tablet 324 mg (324 mg Oral Given 07/05/20 1119)  ondansetron (ZOFRAN) injection 4 mg (4 mg Intravenous Given 07/05/20 1119)  ketorolac (TORADOL) 30 MG/ML injection 15 mg (15 mg Intravenous Given 07/05/20 1437)  diazepam (VALIUM) injection 2.5 mg (2.5 mg Intravenous Given 07/05/20 1437)    ED Course  I have reviewed the triage vital signs and the nursing notes.  Pertinent labs & imaging results that were available during my care of the patient were reviewed by me and considered in my medical decision making (see chart for details).    MDM Rules/Calculators/A&P HEAR Score: 4                         4:46 PM   Labs and imaging reviewed and discussed with pt.  Pt with heart score of 4, delta troponins are normal range and unchanged, other labs stable. She does have chronic anemia, stable today.  Exam and work-up is reassuring with no evidence of unstable angina, also no shortness of breath, no pleuritic history or risk factors for PE.  She will be referred to cardiology for outpatient follow-up for  consideration of cardiac stress testing.  This was discussed with her and she is agreeable and will call for an appointment with Dr. Harl Bowie.  At re-exam,  Pt has had no cp during ed stay, VS have been stable, except for elevation in bp.  She endorses worsened right lower back pain associated with any  movement or attempts at right SLR.  Pt is reproducible favoring exacerbation of her low back pain/ musculoskeletal source.  No dysuria, no cva tenderness, no h/o kidney stones.  Toradol and valium given with improvement in low back pain.  She was encouraged to continue her ibuprofen and/or Tylenol for her low back pain, added Robaxin, discussed heat therapy.  She has no neurologic findings to suggest emergent lumbar pathology, no cauda equina symptoms.  Patient discussed with Dr. Roderic Palau prior to discharge home.   Final Clinical Impression(s) / ED Diagnoses Final diagnoses:  Nonspecific chest pain  Chronic right-sided low back pain without sciatica    Rx / DC Orders ED Discharge Orders         Ordered    methocarbamol (ROBAXIN) 500 MG tablet  Every 8 hours PRN        07/05/20 1551           Evalee Jefferson, PA-C 07/05/20 1650    Milton Ferguson, MD 07/06/20 [phone]

## 2020-10-04 ENCOUNTER — Emergency Department (HOSPITAL_COMMUNITY)
Admission: EM | Admit: 2020-10-04 | Discharge: 2020-10-04 | Disposition: A | Discharge status: Home / Self Care | Payer: PRIVATE HEALTH INSURANCE | Attending: Emergency Medicine | Admitting: Emergency Medicine

## 2020-10-04 ENCOUNTER — Other Ambulatory Visit: Payer: Self-pay

## 2020-10-04 ENCOUNTER — Encounter (HOSPITAL_COMMUNITY): Payer: Self-pay

## 2020-10-04 DIAGNOSIS — Z79899 Other long term (current) drug therapy: Secondary | ICD-10-CM | POA: Diagnosis not present

## 2020-10-04 DIAGNOSIS — I10 Essential (primary) hypertension: Secondary | ICD-10-CM | POA: Insufficient documentation

## 2020-10-04 DIAGNOSIS — M7662 Achilles tendinitis, left leg: Secondary | ICD-10-CM | POA: Diagnosis not present

## 2020-10-04 DIAGNOSIS — M6788 Other specified disorders of synovium and tendon, other site: Secondary | ICD-10-CM

## 2020-10-04 DIAGNOSIS — M7661 Achilles tendinitis, right leg: Secondary | ICD-10-CM | POA: Insufficient documentation

## 2020-10-04 DIAGNOSIS — M79671 Pain in right foot: Secondary | ICD-10-CM | POA: Diagnosis present

## 2020-10-04 MED ORDER — DICLOFENAC SODIUM 1 % EX GEL: 2.0000 g | Freq: Four times a day (QID) | CUTANEOUS | 1 refills | Status: AC

## 2020-10-04 MED ORDER — MELOXICAM 7.5 MG PO TABS: 7.5000 mg | ORAL_TABLET | Freq: Every day | ORAL | 2 refills | Status: DC

## 2020-10-04 NOTE — Discharge Instructions (Addendum)
Schedule to see the Podiatrist for evaluation.

## 2020-10-04 NOTE — ED Triage Notes (Signed)
Pt presents to ED with bilateral heel pain, pt says she has had problems in the past with right Achilles tendon and pain feels the same. Pt denies injlury

## 2020-10-04 NOTE — ED Provider Notes (Signed)
[facility] EMERGENCY DEPARTMENT Provider Note   CSN: [id number] Arrival date & time: 10/04/20  1838     History Chief Complaint  Patient presents with  . Foot Pain    Bilateral heel pain    Mariah Holloway is a 50 y.o. female.  The history is provided by the patient. No language interpreter was used.  Foot Pain This is a new problem. The problem occurs constantly. The problem has not changed since onset.Nothing aggravates the symptoms. Nothing relieves the symptoms. She has tried nothing for the symptoms.   Pt complains of pain in both achilles.  Pt reports she has had similar in the past.  Pt has been standing on her feet a lot     Past Medical History:  Diagnosis Date  . Anemia   . Back pain   . Hypertension   . Lumbar radiculopathy   . Non compliance w medication regimen     There are no problems to display for this patient.   Past Surgical History:  Procedure Laterality Date  . BACK SURGERY    . BACK SURGERY    . BREAST SURGERY    . TUBAL LIGATION       OB History    Gravida      Para      Term      Preterm      AB      Living  2     SAB      IAB      Ectopic      Multiple      Live Births              Family History  Problem Relation Age of Onset  . Diabetes Mother   . Hypertension Father     Social History   Tobacco Use  . Smoking status: Never Smoker  . Smokeless tobacco: Never Used  Vaping Use  . Vaping Use: Never used  Substance Use Topics  . Alcohol use: Yes    Comment: occasional  . Drug use: No    Home Medications Prior to Admission medications   Medication Sig Start Date End Date Taking? Authorizing Provider  diclofenac Sodium (VOLTAREN) 1 % GEL Apply 2 g topically 4 (four) times daily. 10/04/20  Yes Caryl Ada K, PA-C  meloxicam (MOBIC) 7.5 MG tablet Take 1 tablet (7.5 mg total) by mouth daily. 10/04/20 10/04/21 Yes Caryl Ada K, PA-C  amLODipine (NORVASC) 10 MG tablet Take 10 mg by mouth daily.  06/27/20   [provider]  ferrous sulfate 325 (65 FE) MG tablet Take 325 mg by mouth daily with breakfast.    [provider]  lisinopril (PRINIVIL,ZESTRIL) 20 MG tablet Take 1 tablet (20 mg total) by mouth daily. 03/17/18   Francine Graven, DO  methocarbamol (ROBAXIN) 500 MG tablet Take 1 tablet (500 mg total) by mouth every 8 (eight) hours as needed for muscle spasms. 07/05/20   Evalee Jefferson, PA-C  Vitamin D, Ergocalciferol, (DRISDOL) 1.25 MG (50000 UNIT) CAPS capsule Take 50,000 Units by mouth once a week. 05/28/20   [provider]    Allergies    Sulfa antibiotics  Review of Systems   Review of Systems  All other systems reviewed and are negative.   Physical Exam Updated Vital Signs BP (!) 172/87 (BP Location: Right Arm)   Pulse 72   Temp 98.8 F (37.1 C) (Oral)   Resp 18   Ht 5\' 4"  (1.626 m)  Wt 117.9 kg   SpO2 100%   BMI 44.63 kg/m   Physical Exam Vitals and nursing note reviewed.  Constitutional:      Appearance: She is well-developed and well-nourished.  HENT:     Head: Normocephalic.  Eyes:     Extraocular Movements: EOM normal.  Pulmonary:     Effort: Pulmonary effort is normal.  Abdominal:     General: There is no distension.  Musculoskeletal:        General: Normal range of motion.     Cervical back: Normal range of motion.     Comments: Tender bilat achilles tendons   Neurological:     General: No focal deficit present.     Mental Status: She is alert and oriented to person, place, and time.  Psychiatric:        Mood and Affect: Mood and affect and mood normal.     ED Results / Procedures / Treatments   Labs (all labs ordered are listed, but only abnormal results are displayed) Labs Reviewed - No data to display  EKG None  Radiology No results found.  Procedures Procedures   Medications Ordered in ED Medications - No data to display  ED Course  I have reviewed the triage vital signs and the nursing  notes.  Pertinent labs & imaging results that were available during my care of the patient were reviewed by me and considered in my medical decision making (see chart for details).    MDM Rules/Calculators/A&P                          Pt given achilles exercises.  voltaren gel.  Pt referred to Podiatrist for treatment   Final Clinical Impression(s) / ED Diagnoses Final diagnoses:  Achilles tendonosis    Rx / DC Orders ED Discharge Orders         Ordered    diclofenac Sodium (VOLTAREN) 1 % GEL  4 times daily        10/04/20 2043    meloxicam (MOBIC) 7.5 MG tablet  Daily        10/04/20 2044        An After Visit Summary was printed and given to the patient.    Fransico Meadow, Hershal Coria 10/04/20 2246    Fredia Sorrow, MD 10/16/20 [phone]

## 2020-12-11 ENCOUNTER — Emergency Department (HOSPITAL_COMMUNITY)
Admission: EM | Admit: 2020-12-11 | Discharge: 2020-12-11 | Disposition: A | Discharge status: Home / Self Care | Payer: PRIVATE HEALTH INSURANCE | Attending: Emergency Medicine | Admitting: Emergency Medicine

## 2020-12-11 ENCOUNTER — Other Ambulatory Visit: Payer: Self-pay

## 2020-12-11 DIAGNOSIS — Z79899 Other long term (current) drug therapy: Secondary | ICD-10-CM | POA: Diagnosis not present

## 2020-12-11 DIAGNOSIS — T783XXA Angioneurotic edema, initial encounter: Secondary | ICD-10-CM | POA: Diagnosis not present

## 2020-12-11 DIAGNOSIS — T464X5A Adverse effect of angiotensin-converting-enzyme inhibitors, initial encounter: Secondary | ICD-10-CM | POA: Diagnosis not present

## 2020-12-11 DIAGNOSIS — J392 Other diseases of pharynx: Secondary | ICD-10-CM | POA: Diagnosis present

## 2020-12-11 DIAGNOSIS — I10 Essential (primary) hypertension: Secondary | ICD-10-CM | POA: Insufficient documentation

## 2020-12-11 MED ORDER — FAMOTIDINE 20 MG PO TABS: 20.0000 mg | ORAL_TABLET | Freq: Every day | ORAL | 0 refills | Status: AC

## 2020-12-11 MED ORDER — PREDNISONE 10 MG (21) PO TBPK: ORAL_TABLET | ORAL | 0 refills | Status: DC

## 2020-12-11 MED ORDER — EPINEPHRINE 0.3 MG/0.3ML IJ SOAJ
0.3000 mg | Freq: Once | INTRAMUSCULAR | Status: AC
  Filled 2020-12-11: qty 0.3

## 2020-12-11 MED ORDER — DIPHENHYDRAMINE HCL 25 MG PO TABS: 25.0000 mg | ORAL_TABLET | Freq: Four times a day (QID) | ORAL | 0 refills | Status: AC

## 2020-12-11 MED ORDER — EPINEPHRINE 0.3 MG/0.3ML IJ SOAJ
INTRAMUSCULAR | Status: AC
  Administered 2020-12-11: 0.3 mg via INTRAMUSCULAR
  Filled 2020-12-11: qty 0.3

## 2020-12-11 MED ORDER — FAMOTIDINE IN NACL 20-0.9 MG/50ML-% IV SOLN
20.0000 mg | Freq: Once | INTRAVENOUS | Status: AC
  Administered 2020-12-11: 20 mg via INTRAVENOUS
  Filled 2020-12-11: qty 50

## 2020-12-11 MED ORDER — METOPROLOL SUCCINATE ER 25 MG PO TB24: 25.0000 mg | ORAL_TABLET | Freq: Every day | ORAL | 0 refills | Status: AC

## 2020-12-11 MED ORDER — DIPHENHYDRAMINE HCL 50 MG/ML IJ SOLN
50.0000 mg | Freq: Once | INTRAMUSCULAR | Status: AC
  Administered 2020-12-11: 50 mg via INTRAVENOUS
  Filled 2020-12-11: qty 1

## 2020-12-11 MED ORDER — DIPHENHYDRAMINE HCL 50 MG/ML IJ SOLN
25.0000 mg | Freq: Once | INTRAMUSCULAR | Status: AC
  Administered 2020-12-11: 25 mg via INTRAVENOUS
  Filled 2020-12-11: qty 1

## 2020-12-11 MED ORDER — DEXAMETHASONE SODIUM PHOSPHATE 10 MG/ML IJ SOLN
10.0000 mg | Freq: Once | INTRAMUSCULAR | Status: AC
  Administered 2020-12-11: 10 mg via INTRAVENOUS
  Filled 2020-12-11: qty 1

## 2020-12-11 NOTE — Discharge Instructions (Addendum)
STOP Lisinopril.

## 2020-12-11 NOTE — ED Provider Notes (Signed)
[place] DEPT Provider Note: Georgena Spurling, MD, FACEP  CSN: [id number] MRN: [id number] ARRIVAL: 12/11/20 at [place]: [place]  Allergic Reaction   HISTORY OF PRESENT ILLNESS  12/11/20 5:49 AM Mariah Holloway is a 50 y.o. female who has been on lisinopril for years without any adverse reactions.  She is here with a sensation of her throat swelling that awakened her from sleep just prior to arrival.  She feels like her throat is closing and making it difficult to breathe.  She is hyperventilating.  She denies any rash, itching, nausea, vomiting or diarrhea.  She has no known food allergies and her only known drug allergy is sulfa.   Past Medical History:  Diagnosis Date  . Anemia   . Back pain   . Hypertension   . Lumbar radiculopathy   . Non compliance w medication regimen     Past Surgical History:  Procedure Laterality Date  . BACK SURGERY    . BACK SURGERY    . BREAST SURGERY    . TUBAL LIGATION      Family History  Problem Relation Age of Onset  . Diabetes Mother   . Hypertension Father     Social History   Tobacco Use  . Smoking status: Never Smoker  . Smokeless tobacco: Never Used  Vaping Use  . Vaping Use: Never used  Substance Use Topics  . Alcohol use: Yes    Comment: occasional  . Drug use: No    Prior to Admission medications   Medication Sig Start Date End Date Taking? Authorizing Provider  amLODipine (NORVASC) 10 MG tablet Take 10 mg by mouth daily. 06/27/20   [provider]  diclofenac Sodium (VOLTAREN) 1 % GEL Apply 2 g topically 4 (four) times daily. 10/04/20   Fransico Meadow, PA-C  ferrous sulfate 325 (65 FE) MG tablet Take 325 mg by mouth daily with breakfast.    [provider]  lisinopril (PRINIVIL,ZESTRIL) 20 MG tablet Take 1 tablet (20 mg total) by mouth daily. 03/17/18   Francine Graven, DO  meloxicam (MOBIC) 7.5 MG tablet Take 1 tablet (7.5 mg total) by mouth daily. 10/04/20 10/04/21   Fransico Meadow, PA-C  methocarbamol (ROBAXIN) 500 MG tablet Take 1 tablet (500 mg total) by mouth every 8 (eight) hours as needed for muscle spasms. 07/05/20   Evalee Jefferson, PA-C  Vitamin D, Ergocalciferol, (DRISDOL) 1.25 MG (50000 UNIT) CAPS capsule Take 50,000 Units by mouth once a week. 05/28/20   [provider]    Allergies Sulfa antibiotics   REVIEW OF SYSTEMS  Negative except as noted here or in the History of Present Illness.   PHYSICAL EXAMINATION  Initial Vital Signs Blood pressure (!) 210/97, pulse 71, resp. rate (!) 22, height 5\' 4"  (1.626 m), weight 117.9 kg, last menstrual period 11/10/2020, SpO2 100 %.  Examination General: Well-developed, well-nourished female in no acute distress; appearance consistent with age of record HENT: normocephalic; atraumatic; angioedema of the right oropharynx lateral to the uvula; no stridor; no dysphonia Eyes: pupils equal, round and reactive to light; extraocular muscles intact Neck: supple Heart: regular rate and rhythm Lungs: clear to auscultation bilaterally; hyperventilating Abdomen: soft; nondistended; nontender; bowel sounds present Extremities: No deformity; full range of motion; pulses normal Neurologic: Awake, alert and oriented; motor function intact in all extremities and symmetric; no facial droop Skin: Warm and dry; no rash Psychiatric: Normal mood and affect   RESULTS  Summary of this visit's results,  reviewed and interpreted by myself:   EKG Interpretation  Date/Time:    Ventricular Rate:    PR Interval:    QRS Duration:   QT Interval:    QTC Calculation:   R Axis:     Text Interpretation:        Laboratory Studies: No results found for this or any previous visit (from the past 24 hour(s)). Imaging Studies: No results found.  ED COURSE and MDM  Nursing notes, initial and subsequent vitals signs, including pulse oximetry, reviewed and interpreted by myself.  Vitals:   12/11/20 0547 12/11/20  0553 12/11/20 0600 12/11/20 0700  BP: (!) 210/97  (!) 205/109 (!) 172/82  Pulse: 71 76 71 74  Resp: (!) 22 (!) 24 (!) 21 17  SpO2: 100% 100% 100% 97%  Weight:      Height:       Medications  EPINEPHrine (EPI-PEN) injection 0.3 mg (0.3 mg Intramuscular Given 12/11/20 0542)  diphenhydrAMINE (BENADRYL) injection 50 mg (50 mg Intravenous Given 12/11/20 0552)  famotidine (PEPCID) IVPB 20 mg premix (0 mg Intravenous Stopped 12/11/20 0643)  dexamethasone (DECADRON) injection 10 mg (10 mg Intravenous Given 12/11/20 0552)   7:00 AM Signed out to Dr. Gilford Raid.  Presentation is complex consistent with ACE inhibitor induced angioedema.   PROCEDURES  Procedures   ED DIAGNOSES     ICD-10-CM   1. Angiotensin converting enzyme inhibitor-aggravated angioedema, initial encounter  T78.3XXA    T46.4X5A        Shanon Rosser, MD 12/11/20 [phone]

## 2020-12-11 NOTE — ED Notes (Signed)
Patient ambulatory to toilet at this time. Patient voices no complaints.

## 2020-12-11 NOTE — ED Triage Notes (Addendum)
Pt c/o allergic reaction, states she woke up feeling like it was hard to breathe and her throat was closing. EDP at bedside.

## 2020-12-11 NOTE — ED Provider Notes (Signed)
Pt signed out by Dr. Florina Ou pending symptomatic improvement.  Pt has been observed for 4 hrs and is feeling much better.  She knows to never take any ACEI again.  She is put on metoprolol instead.  She is instructed to f/u with pcp.  Return if worse.   Isla Pence, MD 12/11/20 [phone]

## 2021-01-28 ENCOUNTER — Encounter (HOSPITAL_COMMUNITY): Payer: Self-pay | Admitting: Emergency Medicine

## 2021-01-28 ENCOUNTER — Other Ambulatory Visit: Payer: Self-pay

## 2021-01-28 ENCOUNTER — Emergency Department (HOSPITAL_COMMUNITY)
Admission: EM | Admit: 2021-01-28 | Discharge: 2021-01-28 | Disposition: A | Discharge status: Home / Self Care | Payer: PRIVATE HEALTH INSURANCE | Attending: Emergency Medicine | Admitting: Emergency Medicine

## 2021-01-28 DIAGNOSIS — M25512 Pain in left shoulder: Secondary | ICD-10-CM | POA: Insufficient documentation

## 2021-01-28 DIAGNOSIS — Z79899 Other long term (current) drug therapy: Secondary | ICD-10-CM | POA: Diagnosis not present

## 2021-01-28 DIAGNOSIS — M7918 Myalgia, other site: Secondary | ICD-10-CM

## 2021-01-28 DIAGNOSIS — I10 Essential (primary) hypertension: Secondary | ICD-10-CM | POA: Diagnosis not present

## 2021-01-28 MED ORDER — METHOCARBAMOL 500 MG PO TABS: 500.0000 mg | ORAL_TABLET | Freq: Two times a day (BID) | ORAL | 0 refills | Status: DC | PRN

## 2021-01-28 MED ORDER — IBUPROFEN 600 MG PO TABS: 600.0000 mg | ORAL_TABLET | Freq: Four times a day (QID) | ORAL | 0 refills | Status: DC | PRN

## 2021-01-28 NOTE — ED Provider Notes (Signed)
[facility] Provider Note   CSN: [id number] Arrival date & time: 01/28/21  1513     History No chief complaint on file.   Mariah Holloway is a 50 y.o. female with PMH of HTN and lumbar radiculopathy presents the ED with complaints of atraumatic left arm pain.  On my examination, patient reported that she developed pain in her upper left arm that is nonradiating.  She was concerned that perhaps she had slept on it, but is unclear as to the cause.  She works at [employer] and states that she has a physically demanding job which often involves lifting her arms and objects above her head.  She adamantly denies any precipitating injury, but for the past couple of days she has been experiencing difficulty keeping her left arm abducted.  If it is above her head, she can hold it up, but as soon as her arm is horizontal to the ground, she cannot keep her arm elevated.  She describes pain in the area of the deltoid described as "pressure".  It is reproducible with movements.  She denies any clavicular tenderness, elbow discomfort or diminished range of motion, or wrist pain or injury, numbness or weakness, or any other symptoms.  Her inability to keep her arm abducted is limited due to pain.  She also denies any fevers, chills, recent UTI, history of IVDA, or any other symptoms.  HPI     Past Medical History:  Diagnosis Date   Anemia    Back pain    Hypertension    Lumbar radiculopathy    Non compliance w medication regimen     There are no problems to display for this patient.   Past Surgical History:  Procedure Laterality Date   BACK SURGERY     BACK SURGERY     BREAST SURGERY     TUBAL LIGATION       OB History     Gravida      Para      Term      Preterm      AB      Living  2      SAB      IAB      Ectopic      Multiple      Live Births              Family History  Problem Relation Age of Onset   Diabetes Mother     Hypertension Father     Social History   Tobacco Use   Smoking status: Never   Smokeless tobacco: Never  Vaping Use   Vaping Use: Never used  Substance Use Topics   Alcohol use: Yes    Comment: occasional   Drug use: No    Home Medications Prior to Admission medications   Medication Sig Start Date End Date Taking? Authorizing Provider  ibuprofen (ADVIL) 600 MG tablet Take 1 tablet (600 mg total) by mouth every 6 (six) hours as needed. 01/28/21  Yes Corena Herter, PA-C  methocarbamol (ROBAXIN) 500 MG tablet Take 1 tablet (500 mg total) by mouth 2 (two) times daily as needed for muscle spasms. 01/28/21  Yes Corena Herter, PA-C  amLODipine (NORVASC) 10 MG tablet Take 10 mg by mouth daily. 06/27/20   [provider]  Cholecalciferol 1.25 MG (50000 UT) TABS Take 1 tablet by mouth daily. 08/08/20   [provider]  diclofenac Sodium (VOLTAREN) 1 % GEL Apply 2 g  topically 4 (four) times daily. 10/04/20   Fransico Meadow, PA-C  diphenhydrAMINE (BENADRYL) 25 MG tablet Take 1 tablet (25 mg total) by mouth every 6 (six) hours. 12/11/20   Isla Pence, MD  famotidine (PEPCID) 20 MG tablet Take 1 tablet (20 mg total) by mouth daily. 12/11/20   Isla Pence, MD  ferrous sulfate 325 (65 FE) MG tablet Take 325 mg by mouth daily with breakfast.    [provider]  meloxicam (MOBIC) 7.5 MG tablet Take 1 tablet (7.5 mg total) by mouth daily. 10/04/20 10/04/21  Fransico Meadow, PA-C  metoprolol succinate (TOPROL-XL) 25 MG 24 hr tablet Take 1 tablet (25 mg total) by mouth daily. 12/11/20   Isla Pence, MD  predniSONE (STERAPRED UNI-PAK 21 TAB) 10 MG (21) TBPK tablet Take 6 tabs for 2 days, then 5 for 2 days, then 4 for 2 days, then 3 for 2 days, 2 for 2 days, then 1 for 2 days 12/11/20   Isla Pence, MD  lisinopril (PRINIVIL,ZESTRIL) 20 MG tablet Take 1 tablet (20 mg total) by mouth daily. 03/17/18 12/11/20  Francine Graven, DO    Allergies    Ace inhibitors and  Sulfa antibiotics  Review of Systems   Review of Systems  All other systems reviewed and are negative.  Physical Exam Updated Vital Signs BP (!) 183/107 (BP Location: Right Wrist)   Pulse 82   Temp 98.5 F (36.9 C) (Oral)   Resp 18   Ht 5\' 4"  (1.626 m)   Wt 117.9 kg   SpO2 94%   BMI 44.63 kg/m   Physical Exam Vitals and nursing note reviewed. Exam conducted with a chaperone present.  Constitutional:      Appearance: Normal appearance.  HENT:     Head: Normocephalic and atraumatic.  Eyes:     General: No scleral icterus.    Conjunctiva/sclera: Conjunctivae normal.  Cardiovascular:     Rate and Rhythm: Normal rate.     Pulses: Normal pulses.  Pulmonary:     Effort: Pulmonary effort is normal. No respiratory distress.  Musculoskeletal:        General: No swelling or deformity.     Cervical back: Normal range of motion. No rigidity.     Comments: Left shoulder: Describes discomfort and aching in area of deltoid.  No redness or overlying skin changes.  Nontender.  Pain elicited with abduction.  Can raise arm overhead and hold vertically, but cannot perform empty can test without arm dropping due to pain symptoms.  No swelling.  Compartments are soft.  No obvious evidence of injury.  Peripheral pulses intact and symmetric.  Sensation intact throughout. Left elbow: Normal. Left wrist: Normal. No clavicular region or neck discomfort.  No midline cervical TTP.  Skin:    General: Skin is dry.  Neurological:     General: No focal deficit present.     Mental Status: She is alert and oriented to person, place, and time.     GCS: GCS eye subscore is 4. GCS verbal subscore is 5. GCS motor subscore is 6.  Psychiatric:        Mood and Affect: Mood normal.        Behavior: Behavior normal.        Thought Content: Thought content normal.    ED Results / Procedures / Treatments   Labs (all labs ordered are listed, but only abnormal results are displayed) Labs Reviewed - No data to  display  EKG EKG Interpretation  Date/Time:  Monday January 28 2021 16:54:34 EDT Ventricular Rate:  68 PR Interval:  150 QRS Duration: 86 QT Interval:  434 QTC Calculation: 461 R Axis:   36 Text Interpretation: Normal sinus rhythm Normal ECG No significant change since last tracing Confirmed by Dorie Rank [phone]) on 01/28/2021 5:03:14 PM  Radiology No results found.  Procedures Procedures   Medications Ordered in ED Medications - No data to display  ED Course  I have reviewed the triage vital signs and the nursing notes.  Pertinent labs & imaging results that were available during my care of the patient were reviewed by me and considered in my medical decision making (see chart for details).    MDM Rules/Calculators/A&P                          Mariah Holloway was evaluated in Emergency Department on 01/28/2021 for the symptoms described in the history of present illness. She was evaluated in the context of the global COVID-19 pandemic, which necessitated consideration that the patient might be at risk for infection with the SARS-CoV-2 virus that causes COVID-19. Institutional protocols and algorithms that pertain to the evaluation of patients at risk for COVID-19 are in a state of rapid change based on information released by regulatory bodies including the CDC and federal and state organizations. These policies and algorithms were followed during the patient's care in the ED.  I personally reviewed patient's medical chart and all notes from triage and staff during today's encounter. I have also ordered and reviewed all labs and imaging that I felt to be medically necessary in the evaluation of this patient's complaints and with consideration of their physical exam. If needed, translation services were available and utilized.   Patient in the ED with nonspecific deltoid region aching and discomfort.  EKG demonstrates normal sinus rhythm, no changes when compared to prior tracing.   She denies any cardiac complaints.  Her pain is readily reproducible with abduction.  Cannot perform empty can test.  She is able to demonstrate good range of motion.  Denies any IVDA or fevers.  Doubt septic or inflammatory arthritis at this time.  No tenderness in bicipital groove or concern for biceps tendinitis.  Good range of motion and strength distally.  Concern for tendonitis vs rotator cuff injury vs deltoid strain.  Will refer to Dr. Aline Brochure for ongoing evaluation and management.  Do not feel as though plain films or DVT study would yield a significant findings.  Offered swelling, patient would like to proceed.  I feel as though that is reasonable.  Recommending ibuprofen 600 mg every 6 hours as needed for pain control.  We will also provide short course of Robaxin to take as needed.  ER return precautions discussed.  Patient voices understanding and is agreeable to the plan.  Final Clinical Impression(s) / ED Diagnoses Final diagnoses:  Pain of left deltoid    Rx / DC Orders ED Discharge Orders          Ordered    methocarbamol (ROBAXIN) 500 MG tablet  2 times daily PRN        01/28/21 1756    ibuprofen (ADVIL) 600 MG tablet  Every 6 hours PRN        01/28/21 1756             Corena Herter, PA-C 01/28/21 1756    Dorie Rank, MD 01/29/21 1013

## 2021-01-28 NOTE — ED Provider Notes (Signed)
Emergency Medicine Provider Triage Evaluation Note  Mariah Holloway , a 50 y.o. female  was evaluated in triage.  Pt complains of atraumatic left arm pain.  She thought that perhaps she slept on it wrong.  Review of Systems  Positive: Left arm pain, most notably near shoulder.  Worse with movement.  Diminished ROM. Negative: Chest pain, difficulty breathing.  Physical Exam  BP (!) 183/107 (BP Location: Right Wrist)   Pulse 82   Temp 98.5 F (36.9 C) (Oral)   Resp 18   Ht 5\' 4"  (1.626 m)   Wt 117.9 kg   SpO2 94%   BMI 44.63 kg/m  Gen:   Awake, no distress   Resp:  Normal effort MSK:   Diminished ROM left shoulder.   Medical Decision Making  Medically screening exam initiated at 4:05 PM.  Appropriate orders placed.  Kathyrn Burd was informed that the remainder of the evaluation will be completed by another provider, this initial triage assessment does not replace that evaluation, and the importance of remaining in the ED until their evaluation is complete.  Will get an EKG, but suspect musculoskeletal etiology.   Corena Herter, PA-C 01/28/21 1606    Dorie Rank, MD 01/29/21 1012

## 2021-01-28 NOTE — ED Triage Notes (Signed)
Pt c/o left arm pain for the past few days. Pt states at first she thought she had slept wrong but the pain hasn't improved.

## 2021-01-28 NOTE — Discharge Instructions (Addendum)
Your physical exam is largely reassuring.  Do not feel as though there is any acute limb or life-threatening injury.  I suspect that this is a tendinitis versus deltoid strain.  However, I would also like for you to follow-up with Dr. Aline Brochure, local orthopedist.  He may decide to obtain imaging to assess for soft tissue injury.    In the interim, please take ibuprofen 600 mg every 6 hours as needed for pain control.  You were given a prescription for Robaxin which is a muscle relaxer.  You should not drive, work, consume alcohol, or operate machinery while taking this medication as it can make you very drowsy.    Return to the ED or seek immediate medical attention should you experience any new or worsening symptoms.

## 2021-02-19 ENCOUNTER — Ambulatory Visit: Payer: PRIVATE HEALTH INSURANCE | Admitting: Orthopedic Surgery

## 2021-02-20 ENCOUNTER — Ambulatory Visit (INDEPENDENT_AMBULATORY_CARE_PROVIDER_SITE_OTHER): Payer: PRIVATE HEALTH INSURANCE | Admitting: Orthopedic Surgery

## 2021-02-20 ENCOUNTER — Encounter: Payer: Self-pay | Admitting: Orthopedic Surgery

## 2021-02-20 ENCOUNTER — Other Ambulatory Visit: Payer: Self-pay

## 2021-02-20 ENCOUNTER — Ambulatory Visit: Payer: PRIVATE HEALTH INSURANCE

## 2021-02-20 VITALS — BP 145/92 | HR 71 | Ht 64.0 in | Wt 278.0 lb

## 2021-02-20 DIAGNOSIS — Z6841 Body Mass Index (BMI) 40.0 and over, adult: Secondary | ICD-10-CM | POA: Diagnosis not present

## 2021-02-20 DIAGNOSIS — M25512 Pain in left shoulder: Secondary | ICD-10-CM

## 2021-02-20 DIAGNOSIS — M7582 Other shoulder lesions, left shoulder: Secondary | ICD-10-CM | POA: Diagnosis not present

## 2021-02-20 MED ORDER — CYCLOBENZAPRINE HCL 10 MG PO TABS: 10.0000 mg | ORAL_TABLET | Freq: Two times a day (BID) | ORAL | 0 refills | Status: DC | PRN

## 2021-02-20 NOTE — Patient Instructions (Signed)

## 2021-02-20 NOTE — Progress Notes (Signed)
New Patient Visit  Assessment: Mariah Holloway is a 50 y.o. female with the following: 1. Rotator cuff tendonitis, left 2. Severe obesity (BMI >= 40) (HCC) 3. Morbid obesity with BMI of 45.0-49.9, adult ([facility])  Plan: Patient has had pain in the left shoulder for the past 3 weeks.  She does not have a specific injury.  Continues to have pain over the anterior and lateral aspect of the shoulder.  This limits her range of motion.  She has been taking ibuprofen, as well as a muscle relaxer with limited improvement in her pain.  She has been using a sling while at work, to prevent her from causing further irritation by using her arm.  We discussed multiple options including an injection, she would like to proceed with a steroid injection.  I have advised her to wean out of the sling, as this can cause further stiffness in the shoulder which can worsen pain.  I provided her with general shoulder exercises for her to initiate as soon as she is able.  She is also given an updated prescription for Flexeril.  Follow-up in clinic as needed.  Procedure note injection Left shoulder    Verbal consent was obtained to inject the left shoulder, subacromial space Timeout was completed to confirm the site of injection.  The skin was prepped with alcohol and ethyl chloride was sprayed at the injection site.  A 21-gauge needle was used to inject 40 mg of Depo-Medrol and 1% lidocaine (3 cc) into the subacromial space of the left shoulder using a posterolateral approach.  There were no complications. A sterile bandage was applied.    Follow-up: Return if symptoms worsen or fail to improve.  Subjective:  Chief Complaint  Patient presents with   Shoulder Pain    Lt shoulder pain for 2-3 wks. NKI, thought she just slept on it wrong but pain is getting worse.     History of Present Illness: Mariah Holloway is a 50 y.o. RHD female who presents to clinic today for evaluation of her left shoulder.  She has had  severe pain in the left shoulder for the past 3 weeks.  She denies a specific injury.  At first, she felt as though she slept on it wrong, but the pain has continued to worsen.  Pain is primary located in the anterior lateral aspect of her shoulder.  She has severely limited range of motion as a result.  No previous injury to her left shoulder.  She has been using a sling at work, to prevent her from using the arm and aggravating her pain.  Pain does not radiate distally into the front of her arm.  Pain gets worse at night.  She has difficulty sleeping.  No previous injections.  She has not worked with physical therapy.   Review of Systems: No fevers or chills  No numbness or tingling No chest pain No shortness of breath No bowel or bladder dysfunction No GI distress No headaches   Medical History:  Past Medical History:  Diagnosis Date   Anemia    Back pain    Hypertension    Lumbar radiculopathy    Non compliance w medication regimen     Past Surgical History:  Procedure Laterality Date   BACK SURGERY     BACK SURGERY     BREAST SURGERY     TUBAL LIGATION      Family History  Problem Relation Age of Onset   Diabetes Mother  Hypertension Father    Social History   Tobacco Use   Smoking status: Never   Smokeless tobacco: Never  Vaping Use   Vaping Use: Never used  Substance Use Topics   Alcohol use: Yes    Comment: occasional   Drug use: No    Allergies  Allergen Reactions   Ace Inhibitors Swelling    Angioedema    Sulfa Antibiotics Anaphylaxis, Hives and Swelling    Throat swells    Current Meds  Medication Sig   amLODipine (NORVASC) 10 MG tablet Take 10 mg by mouth daily.   Cholecalciferol 1.25 MG (50000 UT) TABS Take 1 tablet by mouth daily.   cyclobenzaprine (FLEXERIL) 10 MG tablet Take 1 tablet (10 mg total) by mouth 2 (two) times daily as needed for muscle spasms.   diclofenac Sodium (VOLTAREN) 1 % GEL Apply 2 g topically 4 (four) times daily.    diphenhydrAMINE (BENADRYL) 25 MG tablet Take 1 tablet (25 mg total) by mouth every 6 (six) hours.   famotidine (PEPCID) 20 MG tablet Take 1 tablet (20 mg total) by mouth daily.   ferrous sulfate 325 (65 FE) MG tablet Take 325 mg by mouth daily with breakfast.   ibuprofen (ADVIL) 600 MG tablet Take 1 tablet (600 mg total) by mouth every 6 (six) hours as needed.   metoprolol succinate (TOPROL-XL) 25 MG 24 hr tablet Take 1 tablet (25 mg total) by mouth daily.    Objective: BP (!) 145/92   Pulse 71   Ht 5\' 4"  (1.626 m)   Wt 278 lb (126.1 kg)   BMI 47.72 kg/m   Physical Exam:  General: Alert and oriented., No acute distress., and Obese female. Gait: Normal gait.  Evaluation of left upper extremity demonstrates no deformity.  No atrophy is appreciated.  Normal landmarks are difficult to palpate due to her body habitus.  Forward flexion limited to approximately 90 degrees.  She has pain with passive range of motion beyond this.  Abduction at her side to 90 degrees, once again with severe pain beyond this range.  Passive external rotation at her side to 45 degrees with minimal discomfort.  Internal rotation to her lumbar spine.   IMAGING: I personally ordered and reviewed the following images  X-rays of the left shoulder obtained in clinic today demonstrates no acute injuries.  No proximal humeral migration.  Well-maintained glenohumeral joint space.  There does appear to be some mild spurring on the undersurface of the acromion.  Impression: Normal left shoulder x-ray   New Medications:  Meds ordered this encounter  Medications   cyclobenzaprine (FLEXERIL) 10 MG tablet    Sig: Take 1 tablet (10 mg total) by mouth 2 (two) times daily as needed for muscle spasms.    Dispense:  20 tablet    Refill:  0      Mordecai Rasmussen, MD  02/20/2021 1:04 PM

## 2021-03-01 ENCOUNTER — Telehealth: Payer: Self-pay | Admitting: Orthopedic Surgery

## 2021-03-01 DIAGNOSIS — M7582 Other shoulder lesions, left shoulder: Secondary | ICD-10-CM

## 2021-03-01 NOTE — Telephone Encounter (Signed)
Patient called stating that she continues to have pain in her left shoulder.  She said it has been throbbing and waking her up at night.  She has been wearing the sling while at work and attempting to do the shoulder exercises.  She is taking the Flexeril.  She wants to know what else can be done.    Please call her to advise  Thanks

## 2021-03-05 ENCOUNTER — Telehealth: Payer: Self-pay | Admitting: Orthopedic Surgery

## 2021-03-05 NOTE — Telephone Encounter (Signed)
Patient called to request to speak with nurse about physical therapy orders - states Forestine Na advised her that she will need to pay out of pocket, as her [employer] will not cover. Please advise.

## 2021-03-06 ENCOUNTER — Ambulatory Visit (HOSPITAL_COMMUNITY): Payer: PRIVATE HEALTH INSURANCE

## 2021-03-15 MED ORDER — LIDOCAINE 1.8 % EX PTCH: 1.0000 | MEDICATED_PATCH | Freq: Two times a day (BID) | CUTANEOUS | 0 refills | Status: AC

## 2021-03-19 ENCOUNTER — Telehealth: Payer: Self-pay | Admitting: Orthopedic Surgery

## 2021-03-19 NOTE — Telephone Encounter (Signed)
Patient had left voice message - I returned call; has question about prescription for the Lidocaine patches through [facility], [place] - [employer] requires prior authorization. Please advise.

## 2021-03-22 NOTE — Telephone Encounter (Signed)
Lidocaine 1.8% Rx not covered by insurance, ok to advise patient to get the lidocaine 4% patches OTC?

## 2021-03-22 NOTE — Telephone Encounter (Signed)
Will call pharmacy to inquire further on PA needed.  Pharmacy closed, opens at [place].

## 2021-03-25 NOTE — Telephone Encounter (Signed)
I called pharmacy and advised, they will let patient know, and cancel this Rx.

## 2021-04-18 ENCOUNTER — Telehealth: Payer: Self-pay | Admitting: Orthopedic Surgery

## 2021-04-18 NOTE — Telephone Encounter (Signed)
Call received from patient via voice message, inquiring about scheduling another appointment for shoulder. Call returned; voice messages are full - unable to leave message.

## 2021-06-10 ENCOUNTER — Emergency Department (HOSPITAL_COMMUNITY)
Admission: EM | Admit: 2021-06-10 | Discharge: 2021-06-11 | Disposition: A | Discharge status: Home / Self Care | Payer: PRIVATE HEALTH INSURANCE | Attending: Emergency Medicine | Admitting: Emergency Medicine

## 2021-06-10 ENCOUNTER — Encounter (HOSPITAL_COMMUNITY): Payer: Self-pay | Admitting: *Deleted

## 2021-06-10 ENCOUNTER — Other Ambulatory Visit: Payer: Self-pay

## 2021-06-10 ENCOUNTER — Emergency Department (HOSPITAL_COMMUNITY): Payer: PRIVATE HEALTH INSURANCE

## 2021-06-10 DIAGNOSIS — I10 Essential (primary) hypertension: Secondary | ICD-10-CM | POA: Diagnosis not present

## 2021-06-10 DIAGNOSIS — M1990 Unspecified osteoarthritis, unspecified site: Secondary | ICD-10-CM | POA: Insufficient documentation

## 2021-06-10 DIAGNOSIS — Z79899 Other long term (current) drug therapy: Secondary | ICD-10-CM | POA: Insufficient documentation

## 2021-06-10 DIAGNOSIS — M11261 Other chondrocalcinosis, right knee: Secondary | ICD-10-CM | POA: Insufficient documentation

## 2021-06-10 DIAGNOSIS — M25561 Pain in right knee: Secondary | ICD-10-CM | POA: Diagnosis present

## 2021-06-10 NOTE — ED Triage Notes (Signed)
Pt states that she thinks she may have twisted her right knee while at work, c/o pain in the right knee, applied lidocaine patch PTA.

## 2021-06-11 DIAGNOSIS — M1990 Unspecified osteoarthritis, unspecified site: Secondary | ICD-10-CM | POA: Diagnosis not present

## 2021-06-11 MED ORDER — PREDNISONE 20 MG PO TABS
40.0000 mg | ORAL_TABLET | Freq: Once | ORAL | Status: AC
  Administered 2021-06-11: 40 mg via ORAL
  Filled 2021-06-11: qty 2

## 2021-06-11 MED ORDER — PREDNISONE 10 MG PO TABS: 20.0000 mg | ORAL_TABLET | Freq: Two times a day (BID) | ORAL | 0 refills | Status: DC

## 2021-06-11 MED ORDER — TRAMADOL HCL 50 MG PO TABS: 50.0000 mg | ORAL_TABLET | Freq: Four times a day (QID) | ORAL | 0 refills | Status: DC | PRN

## 2021-06-11 MED ORDER — HYDROCODONE-ACETAMINOPHEN 5-325 MG PO TABS
2.0000 | ORAL_TABLET | Freq: Once | ORAL | Status: AC
  Administered 2021-06-11: 2 via ORAL
  Filled 2021-06-11: qty 2

## 2021-06-11 NOTE — Discharge Instructions (Signed)
Begin taking prednisone as prescribed.  Begin taking tramadol as prescribed as needed for pain.  Rest, and weightbearing as tolerated.  Follow-up with primary doctor if symptoms are not improving in the next 1 to 2 weeks.

## 2021-06-11 NOTE — ED Provider Notes (Signed)
[facility] EMERGENCY DEPARTMENT Provider Note   CSN: [id number] Arrival date & time: 06/10/21  2004     History Chief Complaint  Patient presents with   Knee Pain    Mariah Holloway is a 50 y.o. female.  Patient is a 50 year old female with history of hypertension and anemia.  Patient presenting today with complaints of right knee pain.  This began at the end of her shift at work.  Patient works at [employer] and is on her feet throughout the day.  She describes pain throughout the knee joint that is worse when she walks.  Pain is somewhat improved when she rests.  She denies any fevers or chills.  The history is provided by the patient.  Knee Pain Location:  Knee Knee location:  R knee Pain details:    Quality:  Aching   Radiates to:  Does not radiate   Severity:  Moderate   Onset quality:  Sudden   Timing:  Constant   Progression:  Worsening     Past Medical History:  Diagnosis Date   Anemia    Back pain    Hypertension    Lumbar radiculopathy    Non compliance w medication regimen     There are no problems to display for this patient.   Past Surgical History:  Procedure Laterality Date   BACK SURGERY     BACK SURGERY     BREAST SURGERY     TUBAL LIGATION       OB History     Gravida      Para      Term      Preterm      AB      Living  2      SAB      IAB      Ectopic      Multiple      Live Births              Family History  Problem Relation Age of Onset   Diabetes Mother    Hypertension Father     Social History   Tobacco Use   Smoking status: Never   Smokeless tobacco: Never  Vaping Use   Vaping Use: Never used  Substance Use Topics   Alcohol use: Yes    Comment: occasional   Drug use: No    Home Medications Prior to Admission medications   Medication Sig Start Date End Date Taking? Authorizing Provider  amLODipine (NORVASC) 10 MG tablet Take 10 mg by mouth daily. 06/27/20   [provider]   Cholecalciferol 1.25 MG (50000 UT) TABS Take 1 tablet by mouth daily. 08/08/20   [provider]  cyclobenzaprine (FLEXERIL) 10 MG tablet Take 1 tablet (10 mg total) by mouth 2 (two) times daily as needed for muscle spasms. 02/20/21   Mordecai Rasmussen, MD  diclofenac Sodium (VOLTAREN) 1 % GEL Apply 2 g topically 4 (four) times daily. 10/04/20   Fransico Meadow, PA-C  diphenhydrAMINE (BENADRYL) 25 MG tablet Take 1 tablet (25 mg total) by mouth every 6 (six) hours. 12/11/20   Isla Pence, MD  famotidine (PEPCID) 20 MG tablet Take 1 tablet (20 mg total) by mouth daily. 12/11/20   Isla Pence, MD  ferrous sulfate 325 (65 FE) MG tablet Take 325 mg by mouth daily with breakfast.    [provider]  ibuprofen (ADVIL) 600 MG tablet Take 1 tablet (600 mg total) by mouth every 6 (six)  hours as needed. 01/28/21   Corena Herter, PA-C  Lidocaine 1.8 % PTCH Apply 1 patch topically 2 (two) times daily. 03/15/21   Mordecai Rasmussen, MD  metoprolol succinate (TOPROL-XL) 25 MG 24 hr tablet Take 1 tablet (25 mg total) by mouth daily. 12/11/20   Isla Pence, MD  lisinopril (PRINIVIL,ZESTRIL) 20 MG tablet Take 1 tablet (20 mg total) by mouth daily. 03/17/18 12/11/20  Francine Graven, DO    Allergies    Ace inhibitors and Sulfa antibiotics  Review of Systems   Review of Systems  All other systems reviewed and are negative.  Physical Exam Updated Vital Signs BP (!) 189/91   Pulse 62   Temp 98.7 F (37.1 C) (Oral)   Resp 18   LMP 06/09/2021   SpO2 100%   Physical Exam Vitals and nursing note reviewed.  Constitutional:      General: She is not in acute distress.    Appearance: Normal appearance. She is not ill-appearing.  HENT:     Head: Normocephalic and atraumatic.  Pulmonary:     Effort: Pulmonary effort is normal.  Musculoskeletal:     Comments: The right knee is grossly normal in appearance and symmetrical with the left.  There is no palpable effusion.  She has good range of  motion with no crepitus, but does have pain with extension.  Anterior and posterior drawer test are both negative and there is no laxity with varus or valgus stress.  Skin:    General: Skin is warm and dry.  Neurological:     Mental Status: She is alert.    ED Results / Procedures / Treatments   Labs (all labs ordered are listed, but only abnormal results are displayed) Labs Reviewed - No data to display  EKG None  Radiology DG Knee Complete 4 Views Right  Result Date: 06/10/2021 CLINICAL DATA:  Knee pain EXAM: RIGHT KNEE - COMPLETE 4+ VIEW COMPARISON:  None. FINDINGS: No fracture or malalignment. Moderate tricompartment arthritis with chondrocalcinosis. No sizeable effusion IMPRESSION: Moderate arthritis.  No acute osseous abnormality. Electronically Signed   By: Donavan Foil M.D.   On: 06/10/2021 21:59    Procedures Procedures   Medications Ordered in ED Medications  predniSONE (DELTASONE) tablet 40 mg (has no administration in time range)  HYDROcodone-acetaminophen (NORCO/VICODIN) 5-325 MG per tablet 2 tablet (has no administration in time range)    ED Course  I have reviewed the triage vital signs and the nursing notes.  Pertinent labs & imaging results that were available during my care of the patient were reviewed by me and considered in my medical decision making (see chart for details).    MDM Rules/Calculators/A&P  Patient presenting with complaints of knee pain as described in the HPI.  Her x-rays show only arthritic changes and I suspect her pain is related to a flareup of this..  The knee joint appears stable and I doubt any sort of internal derangement.  I also highly doubt septic joint.  Patient to be treated with prednisone, rest, pain medication, and follow-up with orthopedics if not improving in the next 1 to 2 weeks.  Final Clinical Impression(s) / ED Diagnoses Final diagnoses:  None    Rx / DC Orders ED Discharge Orders     None        Veryl Speak, MD 06/11/21 [id number]

## 2021-12-27 ENCOUNTER — Other Ambulatory Visit (HOSPITAL_COMMUNITY): Payer: Self-pay | Admitting: Physician Assistant

## 2021-12-27 DIAGNOSIS — Z1231 Encounter for screening mammogram for malignant neoplasm of breast: Secondary | ICD-10-CM

## 2021-12-31 ENCOUNTER — Encounter (INDEPENDENT_AMBULATORY_CARE_PROVIDER_SITE_OTHER): Payer: Self-pay | Admitting: *Deleted

## 2022-01-03 ENCOUNTER — Encounter (HOSPITAL_COMMUNITY): Payer: Self-pay

## 2022-01-03 ENCOUNTER — Ambulatory Visit (HOSPITAL_COMMUNITY)
Admission: RE | Admit: 2022-01-03 | Discharge: 2022-01-03 | Disposition: A | Discharge status: Home / Self Care | Payer: PRIVATE HEALTH INSURANCE | Source: Ambulatory Visit | Attending: Physician Assistant | Admitting: Physician Assistant

## 2022-01-03 DIAGNOSIS — Z1231 Encounter for screening mammogram for malignant neoplasm of breast: Secondary | ICD-10-CM

## 2022-01-10 ENCOUNTER — Ambulatory Visit (INDEPENDENT_AMBULATORY_CARE_PROVIDER_SITE_OTHER): Payer: PRIVATE HEALTH INSURANCE | Admitting: Podiatry

## 2022-01-10 DIAGNOSIS — L603 Nail dystrophy: Secondary | ICD-10-CM

## 2022-01-10 NOTE — Patient Instructions (Signed)

## 2022-01-15 ENCOUNTER — Encounter (HOSPITAL_COMMUNITY): Payer: Self-pay

## 2022-01-15 ENCOUNTER — Encounter: Payer: Self-pay | Admitting: Podiatry

## 2022-01-15 ENCOUNTER — Other Ambulatory Visit: Payer: Self-pay

## 2022-01-15 ENCOUNTER — Emergency Department (HOSPITAL_COMMUNITY): Payer: PRIVATE HEALTH INSURANCE

## 2022-01-15 ENCOUNTER — Emergency Department (HOSPITAL_COMMUNITY)
Admission: EM | Admit: 2022-01-15 | Discharge: 2022-01-16 | Disposition: A | Discharge status: Home / Self Care | Payer: PRIVATE HEALTH INSURANCE | Attending: Emergency Medicine | Admitting: Emergency Medicine

## 2022-01-15 DIAGNOSIS — M7660 Achilles tendinitis, unspecified leg: Secondary | ICD-10-CM

## 2022-01-15 DIAGNOSIS — M7662 Achilles tendinitis, left leg: Secondary | ICD-10-CM | POA: Insufficient documentation

## 2022-01-15 LAB — BASIC METABOLIC PANEL
Anion gap: 7 (ref 5–15)
BUN: 7 mg/dL (ref 6–20)
CO2: 25 mmol/L (ref 22–32)
Calcium: 8.9 mg/dL (ref 8.9–10.3)
Chloride: 106 mmol/L (ref 98–111)
Creatinine, Ser: 0.54 mg/dL (ref 0.44–1.00)
GFR, Estimated: >60 mL/min (ref >60)
Glucose, Bld: 104 mg/dL — ABNORMAL HIGH (ref 70–99)
Potassium: 4.1 mmol/L (ref 3.5–5.1)
Sodium: 138 mmol/L (ref 135–145)

## 2022-01-15 LAB — CBC
HCT: 31.4 % — ABNORMAL LOW (ref 36.0–46.0)
Hemoglobin: 9.1 g/dL — ABNORMAL LOW (ref 12.0–15.0)
MCH: 21.1 pg — ABNORMAL LOW (ref 26.0–34.0)
MCHC: 29 g/dL — ABNORMAL LOW (ref 30.0–36.0)
MCV: 72.7 fL — ABNORMAL LOW (ref 80.0–100.0)
Platelets: 292 10*3/uL (ref 150–400)
RBC: 4.32 MIL/uL (ref 3.87–5.11)
RDW: 20.7 % — ABNORMAL HIGH (ref 11.5–15.5)
WBC: 4.5 10*3/uL (ref 4.0–10.5)
nRBC: 0 % (ref 0.0–0.2)

## 2022-01-15 LAB — SEDIMENTATION RATE: Sed Rate: 42 mm/hr — ABNORMAL HIGH (ref 0–22)

## 2022-01-15 MED ORDER — NAPROXEN 375 MG PO TABS: 375.0000 mg | ORAL_TABLET | Freq: Two times a day (BID) | ORAL | 0 refills | Status: DC

## 2022-01-15 MED ORDER — AMOXICILLIN-POT CLAVULANATE 875-125 MG PO TABS: 1.0000 | ORAL_TABLET | Freq: Two times a day (BID) | ORAL | 0 refills | Status: DC

## 2022-01-15 MED ORDER — HYDROCODONE-ACETAMINOPHEN 5-325 MG PO TABS
1.0000 | ORAL_TABLET | Freq: Once | ORAL | Status: AC
  Administered 2022-01-15: 1 via ORAL
  Filled 2022-01-15: qty 1

## 2022-01-15 MED ORDER — AMOXICILLIN-POT CLAVULANATE 875-125 MG PO TABS
1.0000 | ORAL_TABLET | Freq: Once | ORAL | Status: AC
  Administered 2022-01-15: 1 via ORAL
  Filled 2022-01-15: qty 1

## 2022-01-15 NOTE — ED Provider Notes (Signed)
[facility] EMERGENCY DEPARTMENT Provider Note   CSN: [id number] Arrival date & time: 01/15/22  1840     History {Add pertinent medical, surgical, social history, OB history to HPI:1} Chief Complaint  Patient presents with  . Foot Pain/Swelling    Mariah Holloway is a 51 y.o. female who presents emergency department chief complaint of left Achilles tendon pain.  Patient states that she had her left great toenail removed 5 days ago by Dr. Posey Pronto of podiatry.  Patient states that because of that she has been unable to wear her regular shoes.  Yesterday she was at work and had to walk significantly in a croc.  She states that by the end of the day her Achilles tendon was so severely painful she could not bear weight on the ankle.  She states that it hurts and throbs at rest however as soon as she bears weight on the left foot she has severe throbbing pain and is unable to tolerate any pressure.  She denies fevers or chills.  She has a history of previous episode of Achilles tendinitis but it was in her right foot.  She has no other complaints at this time  HPI     Home Medications Prior to Admission medications   Medication Sig Start Date End Date Taking? Authorizing Provider  amLODipine (NORVASC) 10 MG tablet Take 10 mg by mouth daily. 06/27/20   [provider]  Cholecalciferol 1.25 MG (50000 UT) TABS Take 1 tablet by mouth daily. 08/08/20   [provider]  cyclobenzaprine (FLEXERIL) 10 MG tablet Take 1 tablet (10 mg total) by mouth 2 (two) times daily as needed for muscle spasms. 02/20/21   Mordecai Rasmussen, MD  diclofenac Sodium (VOLTAREN) 1 % GEL Apply 2 g topically 4 (four) times daily. 10/04/20   Fransico Meadow, PA-C  diphenhydrAMINE (BENADRYL) 25 MG tablet Take 1 tablet (25 mg total) by mouth every 6 (six) hours. 12/11/20   Isla Pence, MD  famotidine (PEPCID) 20 MG tablet Take 1 tablet (20 mg total) by mouth daily. 12/11/20   Isla Pence, MD  ferrous sulfate  325 (65 FE) MG tablet Take 325 mg by mouth daily with breakfast.    [provider]  ibuprofen (ADVIL) 600 MG tablet Take 1 tablet (600 mg total) by mouth every 6 (six) hours as needed. 01/28/21   Corena Herter, PA-C  Lidocaine 1.8 % PTCH Apply 1 patch topically 2 (two) times daily. 03/15/21   Mordecai Rasmussen, MD  metoprolol succinate (TOPROL-XL) 25 MG 24 hr tablet Take 1 tablet (25 mg total) by mouth daily. 12/11/20   Isla Pence, MD  predniSONE (DELTASONE) 10 MG tablet Take 2 tablets (20 mg total) by mouth 2 (two) times daily. 06/11/21   Veryl Speak, MD  traMADol (ULTRAM) 50 MG tablet Take 1 tablet (50 mg total) by mouth every 6 (six) hours as needed. 06/11/21   Veryl Speak, MD  lisinopril (PRINIVIL,ZESTRIL) 20 MG tablet Take 1 tablet (20 mg total) by mouth daily. 03/17/18 12/11/20  Francine Graven, DO      Allergies    Ace inhibitors and Sulfa antibiotics    Review of Systems   Review of Systems  Physical Exam Updated Vital Signs BP (!) 163/85 (BP Location: Right Arm)   Pulse 78   Temp 98.9 F (37.2 C) (Oral)   Resp 18   Ht '5\' 4"'$  (1.626 m)   Wt 117.9 kg   LMP 01/05/2022 (Approximate)   SpO2 99%  BMI 44.63 kg/m  Physical Exam Vitals and nursing note reviewed.  Constitutional:      General: She is not in acute distress.    Appearance: She is well-developed. She is not diaphoretic.  HENT:     Head: Normocephalic and atraumatic.     Right Ear: External ear normal.     Left Ear: External ear normal.     Nose: Nose normal.     Mouth/Throat:     Mouth: Mucous membranes are moist.  Eyes:     General: No scleral icterus.    Conjunctiva/sclera: Conjunctivae normal.  Cardiovascular:     Rate and Rhythm: Normal rate and regular rhythm.     Heart sounds: Normal heart sounds. No murmur heard.   No friction rub. No gallop.  Pulmonary:     Effort: Pulmonary effort is normal. No respiratory distress.     Breath sounds: Normal breath sounds.  Abdominal:      General: Bowel sounds are normal. There is no distension.     Palpations: Abdomen is soft. There is no mass.     Tenderness: There is no abdominal tenderness. There is no guarding.  Musculoskeletal:     Cervical back: Normal range of motion.  Skin:    General: Skin is warm and dry.  Neurological:     Mental Status: She is alert and oriented to person, place, and time.  Psychiatric:        Behavior: Behavior normal.    ED Results / Procedures / Treatments   Labs (all labs ordered are listed, but only abnormal results are displayed) Labs Reviewed - No data to display  EKG None  Radiology No results found.  Procedures Procedures  {Document cardiac monitor, telemetry assessment procedure when appropriate:1}  Medications Ordered in ED Medications - No data to display  ED Course/ Medical Decision Making/ A&P Clinical Course as of 01/15/22 2121  Wed Jan 15, 2022  2053 I discussed the case with Dr. Jacqualyn Posey.  He does recommend imaging, basic labs.  If there is any abnormalities I can touch base with him again and if not we will send home with prophylactic antibiotics and close outpatient follow-up as well as pain medication. [AH]    Clinical Course User Index [AH] Margarita Mail, PA-C                           Medical Decision Making Amount and/or Complexity of Data Reviewed Radiology: ordered.   ***  {Document critical care time when appropriate:1} {Document review of labs and clinical decision tools ie heart score, Chads2Vasc2 etc:1}  {Document your independent review of radiology images, and any outside records:1} {Document your discussion with family members, caretakers, and with consultants:1} {Document social determinants of health affecting pt's care:1} {Document your decision making why or why not admission, treatments were needed:1} Final Clinical Impression(s) / ED Diagnoses Final diagnoses:  None    Rx / DC Orders ED Discharge Orders     None

## 2022-01-15 NOTE — Progress Notes (Signed)
Subjective:  Patient ID: Mariah Holloway, female    DOB: 1971-04-26,  MRN: [id number]    51 y.o. female presents with the above complaint.  Patient presents with dystrophic nail.  Patient states is painful to touch is progressive gotten worse.  Hurts with ambulation.  She states that he is puts a lot of pressure on it.  She is tired of it she wants to have it removed.  She had a trauma back in the day which may have led to thickening of the nail.  She denies any other acute complaints.  She is not a diabetic.   Review of Systems: Negative except as noted in the HPI. Denies N/V/F/Ch.  Past Medical History:  Diagnosis Date   Anemia    Back pain    Hypertension    Lumbar radiculopathy    Non compliance w medication regimen     Current Outpatient Medications:    amLODipine (NORVASC) 10 MG tablet, Take 10 mg by mouth daily., Disp: , Rfl:    Cholecalciferol 1.25 MG (50000 UT) TABS, Take 1 tablet by mouth daily., Disp: , Rfl:    cyclobenzaprine (FLEXERIL) 10 MG tablet, Take 1 tablet (10 mg total) by mouth 2 (two) times daily as needed for muscle spasms., Disp: 20 tablet, Rfl: 0   diclofenac Sodium (VOLTAREN) 1 % GEL, Apply 2 g topically 4 (four) times daily., Disp: 100 g, Rfl: 1   diphenhydrAMINE (BENADRYL) 25 MG tablet, Take 1 tablet (25 mg total) by mouth every 6 (six) hours., Disp: 20 tablet, Rfl: 0   famotidine (PEPCID) 20 MG tablet, Take 1 tablet (20 mg total) by mouth daily., Disp: 7 tablet, Rfl: 0   ferrous sulfate 325 (65 FE) MG tablet, Take 325 mg by mouth daily with breakfast., Disp: , Rfl:    ibuprofen (ADVIL) 600 MG tablet, Take 1 tablet (600 mg total) by mouth every 6 (six) hours as needed., Disp: 30 tablet, Rfl: 0   Lidocaine 1.8 % PTCH, Apply 1 patch topically 2 (two) times daily., Disp: 30 patch, Rfl: 0   metoprolol succinate (TOPROL-XL) 25 MG 24 hr tablet, Take 1 tablet (25 mg total) by mouth daily., Disp: 30 tablet, Rfl: 0   predniSONE (DELTASONE) 10 MG tablet, Take 2  tablets (20 mg total) by mouth 2 (two) times daily., Disp: 20 tablet, Rfl: 0   traMADol (ULTRAM) 50 MG tablet, Take 1 tablet (50 mg total) by mouth every 6 (six) hours as needed., Disp: 15 tablet, Rfl: 0  Social History   Tobacco Use  Smoking Status Never  Smokeless Tobacco Never    Allergies  Allergen Reactions   Ace Inhibitors Swelling    Angioedema    Sulfa Antibiotics Anaphylaxis, Hives and Swelling    Throat swells   Objective:  There were no vitals filed for this visit. There is no height or weight on file to calculate BMI. Constitutional Well developed. Well nourished.  Vascular Dorsalis pedis pulses palpable bilaterally. Posterior tibial pulses palpable bilaterally. Capillary refill normal to all digits.  No cyanosis or clubbing noted. Pedal hair growth normal.  Neurologic Normal speech. Oriented to person, place, and time. Epicritic sensation to light touch grossly present bilaterally.  Dermatologic Pain on palpation of the entire/total nail on 1st digit of the left No other open wounds. No skin lesions.  Orthopedic: Normal joint ROM without pain or crepitus bilaterally. No visible deformities. No bony tenderness.   Radiographs: None Assessment:  No diagnosis found. Plan:  Patient was evaluated and  treated and all questions answered.  Nail contusion/dystrophy hallux, left -Patient elects to proceed with minor surgery to remove entire toenail today. Consent reviewed and signed by patient. -Entire/total nail excised. See procedure note. -Educated on post-procedure care including soaking. Written instructions provided and reviewed. -Patient to follow up in 2 weeks for nail check.  Procedure: Excision of entire/total nail  Location: Left 1st toe digit Anesthesia: Lidocaine 1% plain; 1.5 mL and Marcaine 0.5% plain; 1.5 mL, digital block. Skin Prep: Betadine. Dressing: Silvadene; telfa; dry, sterile, compression dressing. Technique: Following skin prep, the  toe was exsanguinated and a tourniquet was secured at the base of the toe. The affected nail border was freed and excised. The tourniquet was then removed and sterile dressing applied. Disposition: Patient tolerated procedure well. Patient to return in 2 weeks for follow-up.   No follow-ups on file.

## 2022-01-15 NOTE — ED Notes (Signed)
Pt had left great toenail removed due to being ingrown last week. States back of left ankle and back of left foot is now hurting since yesterday with much tenderness with palpation. This area slightly more warm more than right side.

## 2022-01-15 NOTE — ED Triage Notes (Addendum)
Pt presents with left foot pain and swelling that started yesterday. Pt had toenail removed Sunday and pt states foot pain has gotten worse. Pt states she can not put any pressure on foot. Describes pain as sharp and throbs when sitting.

## 2022-01-15 NOTE — Discharge Instructions (Signed)
Get help right away if you: Have a sudden popping sound or sensation in your Achilles tendon followed by severe pain. Cannot move your toes or foot. Cannot put any weight on your foot. Your foot or toes become numb and look white or blue even after loosening your bandage or air cast.

## 2022-01-16 ENCOUNTER — Telehealth: Payer: Self-pay | Admitting: *Deleted

## 2022-01-16 LAB — C-REACTIVE PROTEIN: CRP: 1.9 mg/dL — ABNORMAL HIGH (ref <1.0)

## 2022-01-16 NOTE — Telephone Encounter (Signed)
Patient is calling because she had an ingrown procedure one week ago, now her achilles is hurting really bad ,went to ER and was given amoxicillin, naproxen. The medicine given at hospital is not helping with pain. Please schedule for an appointment, new problem. Patient has been scheduled.

## 2022-01-22 ENCOUNTER — Encounter: Payer: Self-pay | Admitting: Podiatry

## 2022-01-22 ENCOUNTER — Ambulatory Visit (INDEPENDENT_AMBULATORY_CARE_PROVIDER_SITE_OTHER): Payer: PRIVATE HEALTH INSURANCE | Admitting: Podiatry

## 2022-01-22 DIAGNOSIS — M7662 Achilles tendinitis, left leg: Secondary | ICD-10-CM

## 2022-01-22 MED ORDER — METHYLPREDNISOLONE 4 MG PO TBPK: ORAL_TABLET | ORAL | 0 refills | Status: DC

## 2022-01-22 NOTE — Progress Notes (Signed)
  Subjective:  Patient ID: Mariah Holloway, female    DOB: [birth year],   MRN: [id number]  Chief Complaint  Patient presents with   Ingrown Toenail    Nail Check left hallux     51 y.o. female presents for concern of left Achilles tendon pain. Patient relates a couple weeks ago she had her left hallux ingrown nail removed. Relates she had to wear different shoes and this flared up her achilles. Has had issues with her achilles on the right in the past. Was seen in ER and given antibiotics prophylactically.  . Denies any other pedal complaints. Denies n/v/f/c.   Past Medical History:  Diagnosis Date   Anemia    Back pain    Hypertension    Lumbar radiculopathy    Non compliance w medication regimen     Objective:  Physical Exam: Vascular: DP/PT pulses 2/4 bilateral. CFT <3 seconds. Normal hair growth on digits. No edema.  Skin. No lacerations or abrasions bilateral feet.  Musculoskeletal: MMT 5/5 bilateral lower extremities in DF, PF, Inversion and Eversion. Deceased ROM in DF of ankle joint.  Neurological: Sensation intact to light touch.   Assessment:  No diagnosis found.   Plan:  Patient was evaluated and treated and all questions answered. -Xrays reviewed from ER. There is some mild spurring noted to posterior and plantar calcaneus.  -Discussed Achilles insertional tendonitis and treatment options with patient.  -Discussed stretching exercises. -Continue with naproxen.  Medrol dose pack provided.  -Heel lifts provided and discussed proper shoewear.  -Discussed if no improvement will consider MRI/PT/EPAT/PRP injections.  -Patient to return to office 6 weeks for re-check.    Lorenda Peck, DPM

## 2022-01-22 NOTE — Patient Instructions (Signed)

## 2022-01-24 ENCOUNTER — Ambulatory Visit: Payer: PRIVATE HEALTH INSURANCE | Admitting: Podiatry

## 2022-03-05 ENCOUNTER — Ambulatory Visit (INDEPENDENT_AMBULATORY_CARE_PROVIDER_SITE_OTHER): Payer: Self-pay | Admitting: Podiatry

## 2022-03-05 DIAGNOSIS — Z91199 Patient's noncompliance with other medical treatment and regimen due to unspecified reason: Secondary | ICD-10-CM

## 2022-03-05 NOTE — Progress Notes (Signed)
No show

## 2022-03-21 ENCOUNTER — Emergency Department (HOSPITAL_COMMUNITY): Payer: PRIVATE HEALTH INSURANCE

## 2022-03-21 ENCOUNTER — Encounter (HOSPITAL_COMMUNITY): Payer: Self-pay | Admitting: Emergency Medicine

## 2022-03-21 ENCOUNTER — Emergency Department (HOSPITAL_COMMUNITY)
Admission: EM | Admit: 2022-03-21 | Discharge: 2022-03-21 | Disposition: A | Discharge status: Home / Self Care | Payer: PRIVATE HEALTH INSURANCE | Attending: Emergency Medicine | Admitting: Emergency Medicine

## 2022-03-21 ENCOUNTER — Other Ambulatory Visit: Payer: Self-pay

## 2022-03-21 DIAGNOSIS — I10 Essential (primary) hypertension: Secondary | ICD-10-CM | POA: Insufficient documentation

## 2022-03-21 DIAGNOSIS — Y92410 Unspecified street and highway as the place of occurrence of the external cause: Secondary | ICD-10-CM | POA: Diagnosis not present

## 2022-03-21 DIAGNOSIS — M5136 Other intervertebral disc degeneration, lumbar region: Secondary | ICD-10-CM | POA: Diagnosis not present

## 2022-03-21 DIAGNOSIS — M545 Low back pain, unspecified: Secondary | ICD-10-CM | POA: Diagnosis present

## 2022-03-21 DIAGNOSIS — Z79899 Other long term (current) drug therapy: Secondary | ICD-10-CM | POA: Insufficient documentation

## 2022-03-21 MED ORDER — LIDOCAINE 5 % EX PTCH
1.0000 | MEDICATED_PATCH | CUTANEOUS | Status: DC
  Administered 2022-03-21: 1 via TRANSDERMAL
  Filled 2022-03-21: qty 1

## 2022-03-21 MED ORDER — NAPROXEN 500 MG PO TABS: 500.0000 mg | ORAL_TABLET | Freq: Two times a day (BID) | ORAL | 0 refills | Status: AC

## 2022-03-21 MED ORDER — METHOCARBAMOL 500 MG PO TABS: 500.0000 mg | ORAL_TABLET | Freq: Two times a day (BID) | ORAL | 0 refills | Status: DC
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
## 2022-03-21 NOTE — Discharge Instructions (Addendum)
Warm compresses to sore muscles for 20 days at a time followed by gentle stretching.  Take naproxen and Robaxin as needed as prescribed.  Follow-up with your primary care provider if pain is not improving.

## 2022-06-23 ENCOUNTER — Encounter (INDEPENDENT_AMBULATORY_CARE_PROVIDER_SITE_OTHER): Payer: Self-pay | Admitting: *Deleted

## 2022-08-15 DIAGNOSIS — I10 Essential (primary) hypertension: Secondary | ICD-10-CM | POA: Diagnosis not present

## 2022-08-15 DIAGNOSIS — Z87892 Personal history of anaphylaxis: Secondary | ICD-10-CM | POA: Diagnosis not present

## 2022-08-15 DIAGNOSIS — Z882 Allergy status to sulfonamides status: Secondary | ICD-10-CM | POA: Diagnosis not present

## 2022-08-15 DIAGNOSIS — Z833 Family history of diabetes mellitus: Secondary | ICD-10-CM | POA: Diagnosis not present

## 2022-08-15 DIAGNOSIS — Z8249 Family history of ischemic heart disease and other diseases of the circulatory system: Secondary | ICD-10-CM | POA: Diagnosis not present

## 2022-08-15 DIAGNOSIS — Z823 Family history of stroke: Secondary | ICD-10-CM | POA: Diagnosis not present

## 2022-08-15 DIAGNOSIS — Z888 Allergy status to other drugs, medicaments and biological substances status: Secondary | ICD-10-CM | POA: Diagnosis not present

## 2022-08-15 DIAGNOSIS — Z6841 Body Mass Index (BMI) 40.0 and over, adult: Secondary | ICD-10-CM | POA: Diagnosis not present

## 2022-08-25 ENCOUNTER — Emergency Department (HOSPITAL_COMMUNITY)
Admission: EM | Admit: 2022-08-25 | Discharge: 2022-08-26 | Disposition: A | Discharge status: Home / Self Care | Payer: PRIVATE HEALTH INSURANCE | Attending: Emergency Medicine | Admitting: Emergency Medicine

## 2022-08-25 ENCOUNTER — Encounter (HOSPITAL_COMMUNITY): Payer: Self-pay | Admitting: Emergency Medicine

## 2022-08-25 ENCOUNTER — Emergency Department (HOSPITAL_COMMUNITY): Payer: PRIVATE HEALTH INSURANCE

## 2022-08-25 ENCOUNTER — Other Ambulatory Visit: Payer: Self-pay

## 2022-08-25 DIAGNOSIS — R1030 Lower abdominal pain, unspecified: Secondary | ICD-10-CM | POA: Insufficient documentation

## 2022-08-25 DIAGNOSIS — M25511 Pain in right shoulder: Secondary | ICD-10-CM | POA: Diagnosis not present

## 2022-08-25 DIAGNOSIS — M25551 Pain in right hip: Secondary | ICD-10-CM | POA: Insufficient documentation

## 2022-08-25 DIAGNOSIS — D649 Anemia, unspecified: Secondary | ICD-10-CM | POA: Insufficient documentation

## 2022-08-25 DIAGNOSIS — Z79899 Other long term (current) drug therapy: Secondary | ICD-10-CM | POA: Insufficient documentation

## 2022-08-25 DIAGNOSIS — E876 Hypokalemia: Secondary | ICD-10-CM | POA: Diagnosis not present

## 2022-08-25 DIAGNOSIS — Y92410 Unspecified street and highway as the place of occurrence of the external cause: Secondary | ICD-10-CM | POA: Insufficient documentation

## 2022-08-25 LAB — CBC
HCT: 27.8 % — ABNORMAL LOW (ref 36.0–46.0)
Hemoglobin: 8.1 g/dL — ABNORMAL LOW (ref 12.0–15.0)
MCH: 20.5 pg — ABNORMAL LOW (ref 26.0–34.0)
MCHC: 29.1 g/dL — ABNORMAL LOW (ref 30.0–36.0)
MCV: 70.2 fL — ABNORMAL LOW (ref 80.0–100.0)
Platelets: 269 10*3/uL (ref 150–400)
RBC: 3.96 MIL/uL (ref 3.87–5.11)
RDW: 19.9 % — ABNORMAL HIGH (ref 11.5–15.5)
WBC: 4.2 10*3/uL (ref 4.0–10.5)
nRBC: 0 % (ref 0.0–0.2)

## 2022-08-25 LAB — BASIC METABOLIC PANEL
Anion gap: 11 (ref 5–15)
BUN: 11 mg/dL (ref 6–20)
CO2: 22 mmol/L (ref 22–32)
Calcium: 8.6 mg/dL — ABNORMAL LOW (ref 8.9–10.3)
Chloride: 103 mmol/L (ref 98–111)
Creatinine, Ser: 0.69 mg/dL (ref 0.44–1.00)
GFR, Estimated: >60 mL/min (ref >60)
Glucose, Bld: 100 mg/dL — ABNORMAL HIGH (ref 70–99)
Potassium: 3.2 mmol/L — ABNORMAL LOW (ref 3.5–5.1)
Sodium: 136 mmol/L (ref 135–145)

## 2022-08-25 LAB — I-STAT BETA HCG BLOOD, ED (MC, WL, AP ONLY): I-stat hCG, quantitative: <5 m[IU]/mL (ref <5)

## 2022-08-25 MED ORDER — MORPHINE SULFATE (PF) 2 MG/ML IV SOLN
2.0000 mg | Freq: Once | INTRAVENOUS | Status: AC
  Administered 2022-08-25: 2 mg via INTRAVENOUS
  Filled 2022-08-25: qty 1

## 2022-08-25 MED ORDER — METHOCARBAMOL 500 MG PO TABS: 500.0000 mg | ORAL_TABLET | Freq: Two times a day (BID) | ORAL | 0 refills | Status: DC

## 2022-08-25 MED ORDER — IOHEXOL 300 MG/ML  SOLN
100.0000 mL | Freq: Once | INTRAMUSCULAR | Status: AC | PRN
  Administered 2022-08-25: 100 mL via INTRAVENOUS

## 2022-08-25 MED ORDER — POTASSIUM CHLORIDE CRYS ER 20 MEQ PO TBCR
40.0000 meq | EXTENDED_RELEASE_TABLET | Freq: Once | ORAL | Status: AC
  Administered 2022-08-25: 40 meq via ORAL
  Filled 2022-08-25: qty 2
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
## 2022-08-25 NOTE — ED Notes (Signed)
Pt returned from CT scan.

## 2022-08-25 NOTE — Discharge Instructions (Signed)
As we discussed, your workup in the ER today was reassuring for acute findings.  X-ray and CT imaging did not reveal any emergent concerns.  I have given you a prescription for a muscle relaxer for you to take as prescribed as needed for residual muscle soreness.  Please do not drive or operate heavy machinery while taking this medication. I also recommend that you rest, ice, compress, and elevate areas that are sore and take Tylenol/ibuprofen as needed for pain.  Additionally, the CT scan of your abdomen did show the incidental finding of a cyst on your right ovary.  You will need to follow-up with your primary care doctor at your earliest convenience to schedule an ultrasound to further look at the cyst.  Return if development of any new or worsening symptoms.

## 2022-09-03 ENCOUNTER — Other Ambulatory Visit (HOSPITAL_COMMUNITY): Payer: Self-pay | Admitting: Physician Assistant

## 2022-09-03 DIAGNOSIS — D259 Leiomyoma of uterus, unspecified: Secondary | ICD-10-CM

## 2022-09-12 ENCOUNTER — Ambulatory Visit (HOSPITAL_COMMUNITY)
Admission: RE | Admit: 2022-09-12 | Discharge: 2022-09-12 | Disposition: A | Discharge status: Home / Self Care | Payer: PRIVATE HEALTH INSURANCE | Source: Ambulatory Visit | Attending: Physician Assistant | Admitting: Physician Assistant

## 2022-09-12 DIAGNOSIS — D259 Leiomyoma of uterus, unspecified: Secondary | ICD-10-CM | POA: Diagnosis present

## 2022-09-17 ENCOUNTER — Encounter: Payer: Self-pay | Admitting: Obstetrics & Gynecology

## 2022-09-17 ENCOUNTER — Ambulatory Visit (INDEPENDENT_AMBULATORY_CARE_PROVIDER_SITE_OTHER): Payer: PRIVATE HEALTH INSURANCE | Admitting: Obstetrics & Gynecology

## 2022-09-17 VITALS — BP 133/76 | HR 78 | Ht 64.0 in | Wt 258.2 lb

## 2022-09-17 DIAGNOSIS — D251 Intramural leiomyoma of uterus: Secondary | ICD-10-CM | POA: Diagnosis not present

## 2022-09-17 DIAGNOSIS — R102 Pelvic and perineal pain: Secondary | ICD-10-CM

## 2022-09-17 DIAGNOSIS — Z6841 Body Mass Index (BMI) 40.0 and over, adult: Secondary | ICD-10-CM
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
## 2022-10-16 ENCOUNTER — Encounter: Payer: Self-pay | Admitting: Radiology

## 2022-11-25 ENCOUNTER — Emergency Department (HOSPITAL_COMMUNITY)
Admission: EM | Admit: 2022-11-25 | Discharge: 2022-11-25 | Disposition: A | Discharge status: Home / Self Care | Payer: Self-pay | Attending: Emergency Medicine | Admitting: Emergency Medicine

## 2022-11-25 ENCOUNTER — Encounter (HOSPITAL_COMMUNITY): Payer: Self-pay

## 2022-11-25 ENCOUNTER — Other Ambulatory Visit: Payer: Self-pay

## 2022-11-25 DIAGNOSIS — H169 Unspecified keratitis: Secondary | ICD-10-CM | POA: Insufficient documentation

## 2022-11-25 MED ORDER — TETRACAINE HCL 0.5 % OP SOLN
2.0000 [drp] | Freq: Once | OPHTHALMIC | Status: AC
  Administered 2022-11-25: 2 [drp] via OPHTHALMIC
  Filled 2022-11-25: qty 4

## 2022-11-25 MED ORDER — FLUORESCEIN SODIUM 1 MG OP STRP
1.0000 | ORAL_STRIP | Freq: Once | OPHTHALMIC | Status: AC
  Administered 2022-11-25: 1 via OPHTHALMIC
  Filled 2022-11-25: qty 1

## 2022-11-25 MED ORDER — ERYTHROMYCIN 5 MG/GM OP OINT
TOPICAL_OINTMENT | Freq: Once | OPHTHALMIC | Status: AC
  Filled 2022-11-25: qty 3.5

## 2022-11-25 NOTE — ED Triage Notes (Signed)
Pt states she woke up tonight with eye pain and feelings of something stuck in her eye. Eye is red and watering. Pt tried to flush it with contact solution with no luck.

## 2022-11-25 NOTE — ED Provider Notes (Signed)
[place] EMERGENCY DEPARTMENT AT [facility] Provider Note   CSN: [id number] Arrival date & time: 11/25/22  0422     History  Chief Complaint  Patient presents with   Eye Pain    Mariah Holloway is a 52 y.o. female.  52 year old female who presents to the ER today secondary to left eye irritation.  Patient dates she woke up and felt like there was something in it.  Burning and some deeper down pain.  No vision changes except for blurry when is watering.  No history of the same.  Unknown injury.  She did not like watch the solar eclipse yesterday.   Eye Pain       Home Medications Prior to Admission medications   Medication Sig Start Date End Date Taking? Authorizing Provider  amLODipine (NORVASC) 10 MG tablet Take 10 mg by mouth daily. 06/27/20   [provider]  Cholecalciferol 1.25 MG (50000 UT) TABS Take 1 tablet by mouth daily. 08/08/20   [provider]  diclofenac Sodium (VOLTAREN) 1 % GEL Apply 2 g topically 4 (four) times daily. 10/04/20   Elson Areas, PA-C  diphenhydrAMINE (BENADRYL) 25 MG tablet Take 1 tablet (25 mg total) by mouth every 6 (six) hours. 12/11/20   Jacalyn Lefevre, MD  famotidine (PEPCID) 20 MG tablet Take 1 tablet (20 mg total) by mouth daily. 12/11/20   Jacalyn Lefevre, MD  ferrous sulfate 325 (65 FE) MG tablet Take 325 mg by mouth daily with breakfast.    [provider]  Lidocaine 1.8 % PTCH Apply 1 patch topically 2 (two) times daily. Patient not taking: Reported on 09/17/2022 03/15/21   Oliver Barre, MD  methocarbamol (ROBAXIN) 500 MG tablet Take 1 tablet (500 mg total) by mouth 2 (two) times daily. 08/25/22   Smoot, Shawn Route, PA-C  metoprolol succinate (TOPROL-XL) 25 MG 24 hr tablet Take 1 tablet (25 mg total) by mouth daily. 12/11/20   Jacalyn Lefevre, MD  lisinopril (PRINIVIL,ZESTRIL) 20 MG tablet Take 1 tablet (20 mg total) by mouth daily. 03/17/18 12/11/20  Samuel Jester, DO      Allergies    Ace  inhibitors and Sulfa antibiotics    Review of Systems   Review of Systems  Eyes:  Positive for pain.    Physical Exam Updated Vital Signs BP (!) 178/91   Pulse 64   Temp 97.8 F (36.6 C) (Oral)   Resp 17   Wt 117 kg   SpO2 100%   BMI 44.29 kg/m  Physical Exam Vitals and nursing note reviewed.  Constitutional:      Appearance: She is well-developed.  HENT:     Head: Normocephalic and atraumatic.  Eyes:     Extraocular Movements: Extraocular movements intact.     Pupils: Pupils are equal, round, and reactive to light.     Comments: Left eye with tearing, injection, pressure of 19, woods lamp with a couple areas of uptake. Complete relief with tetracaine.   Cardiovascular:     Rate and Rhythm: Normal rate and regular rhythm.  Pulmonary:     Effort: No respiratory distress.     Breath sounds: No stridor.  Abdominal:     General: There is no distension.  Musculoskeletal:     Cervical back: Normal range of motion.  Neurological:     Mental Status: She is alert.     ED Results / Procedures / Treatments   Labs (all labs ordered are listed, but only abnormal results  are displayed) Labs Reviewed - No data to display  EKG None  Radiology No results found.  Procedures Procedures    Medications Ordered in ED Medications  fluorescein ophthalmic strip 1 strip (1 strip Left Eye Given by Other 11/25/22 0503)  tetracaine (PONTOCAINE) 0.5 % ophthalmic solution 2 drop (2 drops Left Eye Given by Other 11/25/22 0503)  erythromycin ophthalmic ointment ( Left Eye Given 11/25/22 0600)    ED Course/ Medical Decision Making/ A&P                             Medical Decision Making Risk Prescription drug management.   Keratitis vs abrasion. Feels better, suspect her BP was high because of the pain. No e/o glaucoma or open globe. Will ppx treat with antibiotic ointment. Ophtho follow up if not improving.   Final Clinical Impression(s) / ED Diagnoses Final diagnoses:   Keratitis    Rx / DC Orders ED Discharge Orders     None         Coben Godshall, Barbara Cower, MD 11/25/22 [phone]

## 2022-11-25 NOTE — Discharge Instructions (Addendum)
Use the ointment we provided you 3 times a day for a week even after symptoms improve.   Follow up with the eye doctor above if not improving within a couple days.   Return here if you have new/worsening symptoms.

## 2023-01-03 ENCOUNTER — Other Ambulatory Visit: Payer: Self-pay

## 2023-01-03 ENCOUNTER — Emergency Department (HOSPITAL_COMMUNITY): Payer: PRIVATE HEALTH INSURANCE

## 2023-01-03 ENCOUNTER — Encounter (HOSPITAL_COMMUNITY): Payer: Self-pay

## 2023-01-03 ENCOUNTER — Emergency Department (HOSPITAL_COMMUNITY)
Admission: EM | Admit: 2023-01-03 | Discharge: 2023-01-03 | Disposition: A | Discharge status: Home / Self Care | Payer: PRIVATE HEALTH INSURANCE | Attending: Emergency Medicine | Admitting: Emergency Medicine

## 2023-01-03 DIAGNOSIS — M25512 Pain in left shoulder: Secondary | ICD-10-CM | POA: Insufficient documentation

## 2023-01-03 DIAGNOSIS — M25561 Pain in right knee: Secondary | ICD-10-CM | POA: Insufficient documentation

## 2023-01-03 DIAGNOSIS — H1132 Conjunctival hemorrhage, left eye: Secondary | ICD-10-CM

## 2023-01-03 DIAGNOSIS — T148XXA Other injury of unspecified body region, initial encounter: Secondary | ICD-10-CM | POA: Insufficient documentation

## 2023-01-03 DIAGNOSIS — S060X0A Concussion without loss of consciousness, initial encounter: Secondary | ICD-10-CM | POA: Insufficient documentation

## 2023-01-03 DIAGNOSIS — M25552 Pain in left hip: Secondary | ICD-10-CM | POA: Diagnosis not present

## 2023-01-03 DIAGNOSIS — M546 Pain in thoracic spine: Secondary | ICD-10-CM | POA: Diagnosis not present

## 2023-01-03 DIAGNOSIS — M25511 Pain in right shoulder: Secondary | ICD-10-CM | POA: Insufficient documentation

## 2023-01-03 DIAGNOSIS — T07XXXA Unspecified multiple injuries, initial encounter: Secondary | ICD-10-CM

## 2023-01-03 DIAGNOSIS — I10 Essential (primary) hypertension: Secondary | ICD-10-CM | POA: Insufficient documentation

## 2023-01-03 DIAGNOSIS — M25551 Pain in right hip: Secondary | ICD-10-CM | POA: Diagnosis not present

## 2023-01-03 DIAGNOSIS — Z79899 Other long term (current) drug therapy: Secondary | ICD-10-CM | POA: Insufficient documentation

## 2023-01-03 DIAGNOSIS — R519 Headache, unspecified: Secondary | ICD-10-CM | POA: Diagnosis present

## 2023-01-03 LAB — CBC WITH DIFFERENTIAL/PLATELET
Abs Immature Granulocytes: 0.02 10*3/uL (ref 0.00–0.07)
Basophils Absolute: 0 10*3/uL (ref 0.0–0.1)
Basophils Relative: 1 %
Eosinophils Absolute: 0 10*3/uL (ref 0.0–0.5)
Eosinophils Relative: 1 %
HCT: 30.3 % — ABNORMAL LOW (ref 36.0–46.0)
Hemoglobin: 9.4 g/dL — ABNORMAL LOW (ref 12.0–15.0)
Immature Granulocytes: 0 %
Lymphocytes Relative: 36 %
Lymphs Abs: 1.8 10*3/uL (ref 0.7–4.0)
MCH: 24.7 pg — ABNORMAL LOW (ref 26.0–34.0)
MCHC: 31 g/dL (ref 30.0–36.0)
MCV: 79.7 fL — ABNORMAL LOW (ref 80.0–100.0)
Monocytes Absolute: 0.5 10*3/uL (ref 0.1–1.0)
Monocytes Relative: 10 %
Neutro Abs: 2.7 10*3/uL (ref 1.7–7.7)
Neutrophils Relative %: 52 %
Platelets: 214 10*3/uL (ref 150–400)
RBC: 3.8 MIL/uL — ABNORMAL LOW (ref 3.87–5.11)
RDW: 18.2 % — ABNORMAL HIGH (ref 11.5–15.5)
WBC: 5 10*3/uL (ref 4.0–10.5)
nRBC: 0 % (ref 0.0–0.2)

## 2023-01-03 LAB — BASIC METABOLIC PANEL
Anion gap: 10 (ref 5–15)
BUN: 12 mg/dL (ref 6–20)
CO2: 23 mmol/L (ref 22–32)
Calcium: 8.8 mg/dL — ABNORMAL LOW (ref 8.9–10.3)
Chloride: 102 mmol/L (ref 98–111)
Creatinine, Ser: 0.88 mg/dL (ref 0.44–1.00)
GFR, Estimated: >60 mL/min (ref >60)
Glucose, Bld: 100 mg/dL — ABNORMAL HIGH (ref 70–99)
Potassium: 3.3 mmol/L — ABNORMAL LOW (ref 3.5–5.1)
Sodium: 135 mmol/L (ref 135–145)

## 2023-01-03 LAB — I-STAT BETA HCG BLOOD, ED (MC, WL, AP ONLY): I-stat hCG, quantitative: <5 m[IU]/mL (ref <5)

## 2023-01-03 MED ORDER — FLUORESCEIN SODIUM 1 MG OP STRP
1.0000 | ORAL_STRIP | Freq: Once | OPHTHALMIC | Status: AC
  Administered 2023-01-03: 1 via OPHTHALMIC
  Filled 2023-01-03: qty 1

## 2023-01-03 MED ORDER — METHOCARBAMOL 500 MG PO TABS: 500.0000 mg | ORAL_TABLET | Freq: Two times a day (BID) | ORAL | 0 refills | Status: AC

## 2023-01-03 MED ORDER — ONDANSETRON HCL 4 MG/2ML IJ SOLN
4.0000 mg | Freq: Once | INTRAMUSCULAR | Status: AC
  Administered 2023-01-03: 4 mg via INTRAVENOUS
  Filled 2023-01-03: qty 2

## 2023-01-03 MED ORDER — TETRACAINE HCL 0.5 % OP SOLN
1.0000 [drp] | Freq: Once | OPHTHALMIC | Status: AC
  Administered 2023-01-03: 1 [drp] via OPHTHALMIC
  Filled 2023-01-03: qty 4

## 2023-01-03 MED ORDER — MORPHINE SULFATE (PF) 4 MG/ML IV SOLN
4.0000 mg | Freq: Once | INTRAVENOUS | Status: AC
  Administered 2023-01-03: 4 mg via INTRAVENOUS
  Filled 2023-01-03: qty 1

## 2023-01-03 NOTE — Discharge Instructions (Addendum)
No acute findings on imaging today. Prescribed Robaxin for pain relief. Patient feels safe to go home.  Please return to ED if headache worsens or becomes persistent, you develop weakness or numbness in any part of your body, slurred speech, vision changes, or confusion. You vomit repeatedly. You lose consciousness, are sleepier than normal, or are difficult to wake up. You have a seizure.
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
## 2023-01-03 NOTE — ED Notes (Signed)
[employer] contacted per pt request. 

## 2023-04-28 ENCOUNTER — Ambulatory Visit: Payer: Medicaid Other | Admitting: Orthopedic Surgery

## 2023-06-30 ENCOUNTER — Other Ambulatory Visit (HOSPITAL_COMMUNITY): Payer: Self-pay | Admitting: Family Medicine

## 2023-06-30 DIAGNOSIS — Z1231 Encounter for screening mammogram for malignant neoplasm of breast: Secondary | ICD-10-CM

## 2023-09-28 ENCOUNTER — Other Ambulatory Visit (HOSPITAL_COMMUNITY): Payer: Self-pay | Admitting: Internal Medicine

## 2023-09-28 DIAGNOSIS — Z981 Arthrodesis status: Secondary | ICD-10-CM

## 2023-10-06 ENCOUNTER — Ambulatory Visit (HOSPITAL_COMMUNITY)
Admission: RE | Admit: 2023-10-06 | Discharge: 2023-10-06 | Disposition: A | Discharge status: Home / Self Care | Payer: PRIVATE HEALTH INSURANCE | Source: Ambulatory Visit | Attending: Internal Medicine | Admitting: Internal Medicine

## 2023-10-06 DIAGNOSIS — Z981 Arthrodesis status: Secondary | ICD-10-CM | POA: Diagnosis present

## 2023-11-20 ENCOUNTER — Ambulatory Visit (HOSPITAL_COMMUNITY)
Admission: RE | Admit: 2023-11-20 | Discharge: 2023-11-20 | Disposition: A | Discharge status: Home / Self Care | Payer: PRIVATE HEALTH INSURANCE | Source: Ambulatory Visit | Attending: Family Medicine | Admitting: Family Medicine

## 2023-11-20 DIAGNOSIS — Z1231 Encounter for screening mammogram for malignant neoplasm of breast: Secondary | ICD-10-CM | POA: Diagnosis present

## 2024-01-24 IMAGING — MG MM DIGITAL SCREENING BILAT W/ TOMO AND CAD
6 of 12 series · 6 of 36 positions shown · non-contrast
Comparison: None available.

CLINICAL DATA: Screening.

EXAM:
DIGITAL SCREENING BILATERAL MAMMOGRAM WITH TOMOSYNTHESIS AND CAD
TECHNIQUE: Bilateral screening digital craniocaudal and mediolateral oblique
mammograms were obtained. Bilateral screening digital breast
tomosynthesis was performed. The images were evaluated with
computer-aided detection.

[L CC synth-2D]
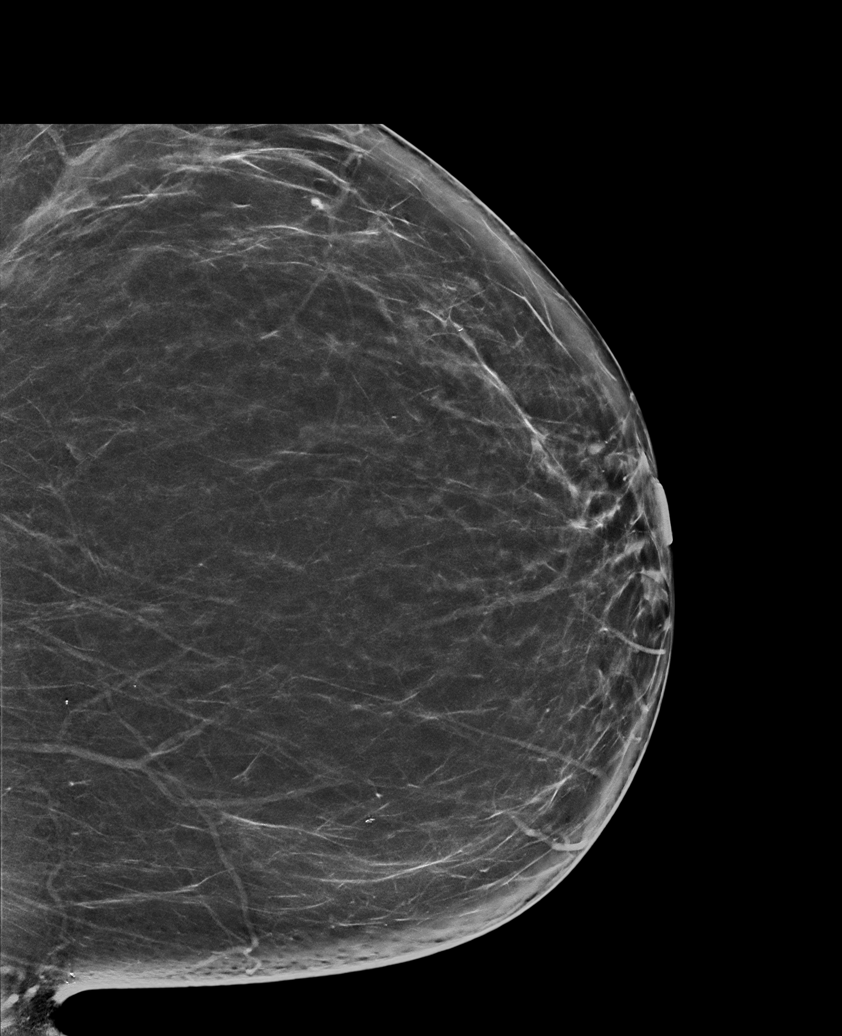

[R MLO synth-2D (1 of 2)]
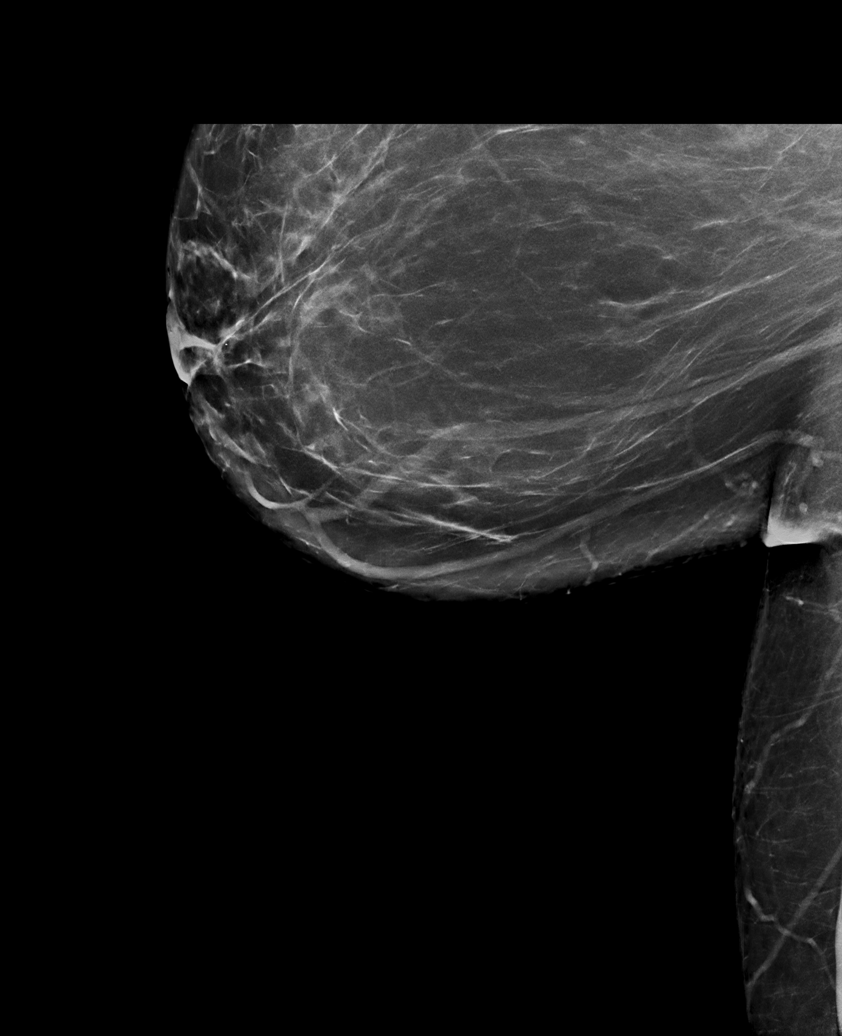

[R CC synth-2D]
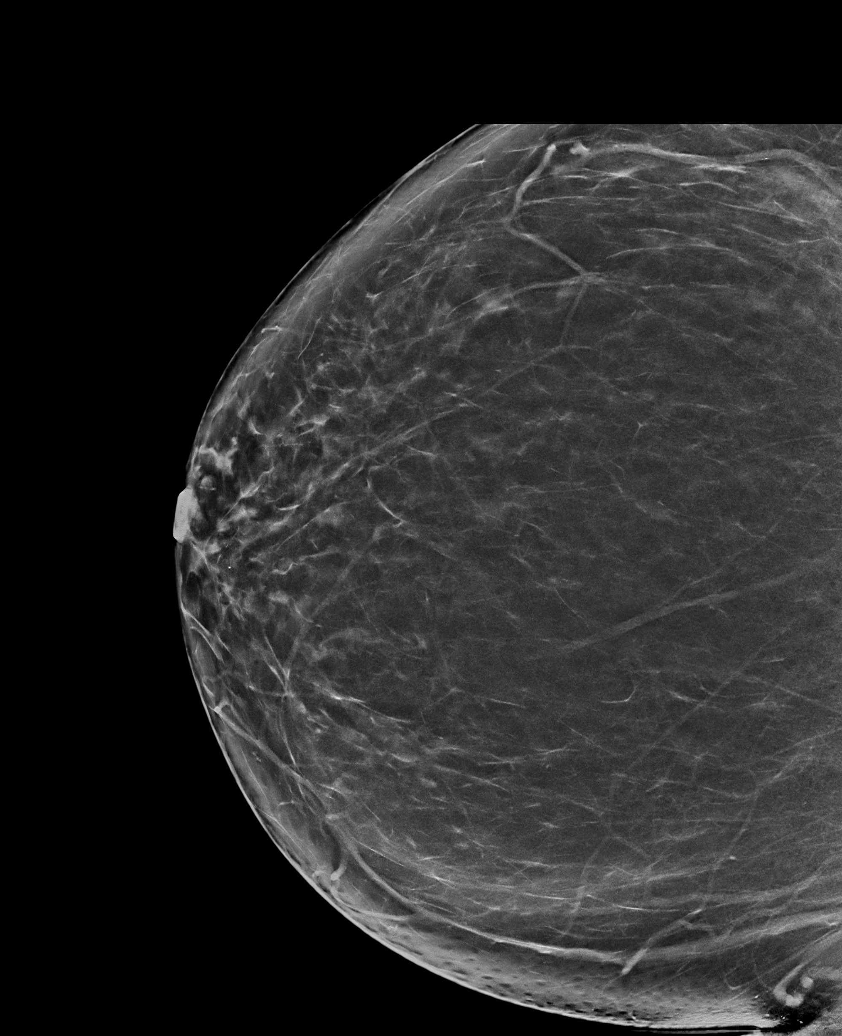

[R MLO synth-2D (2 of 2)]
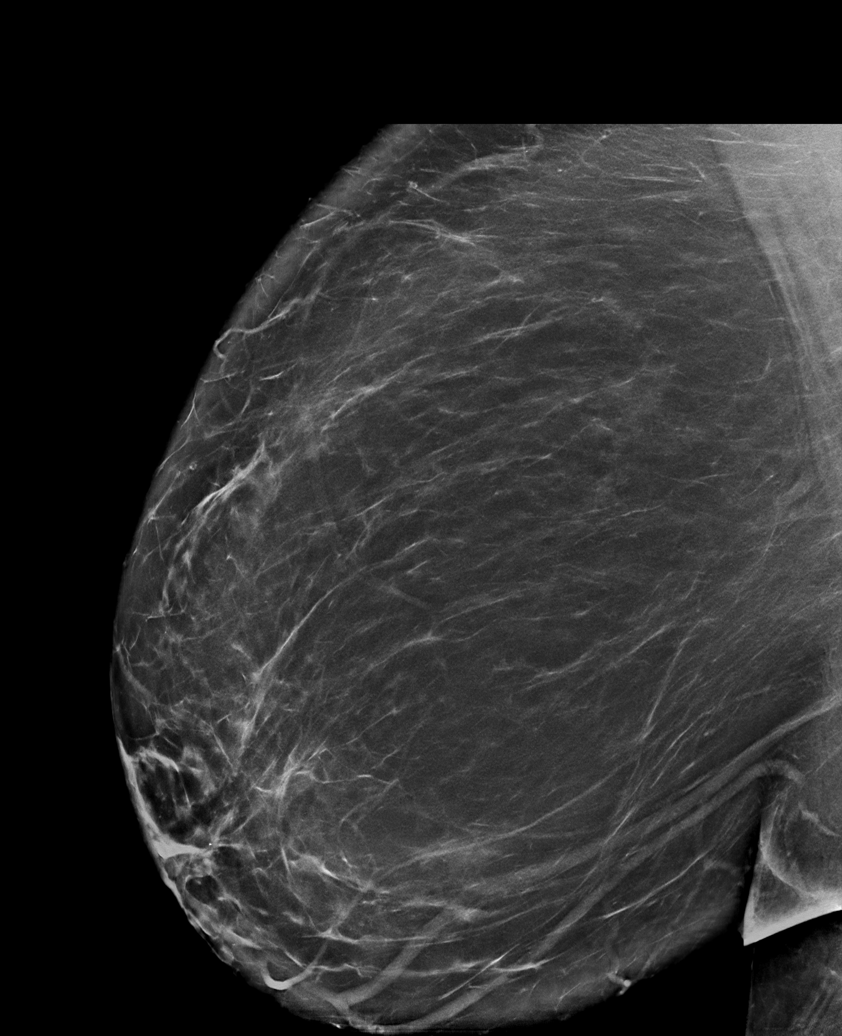

[R CV synth-2D]
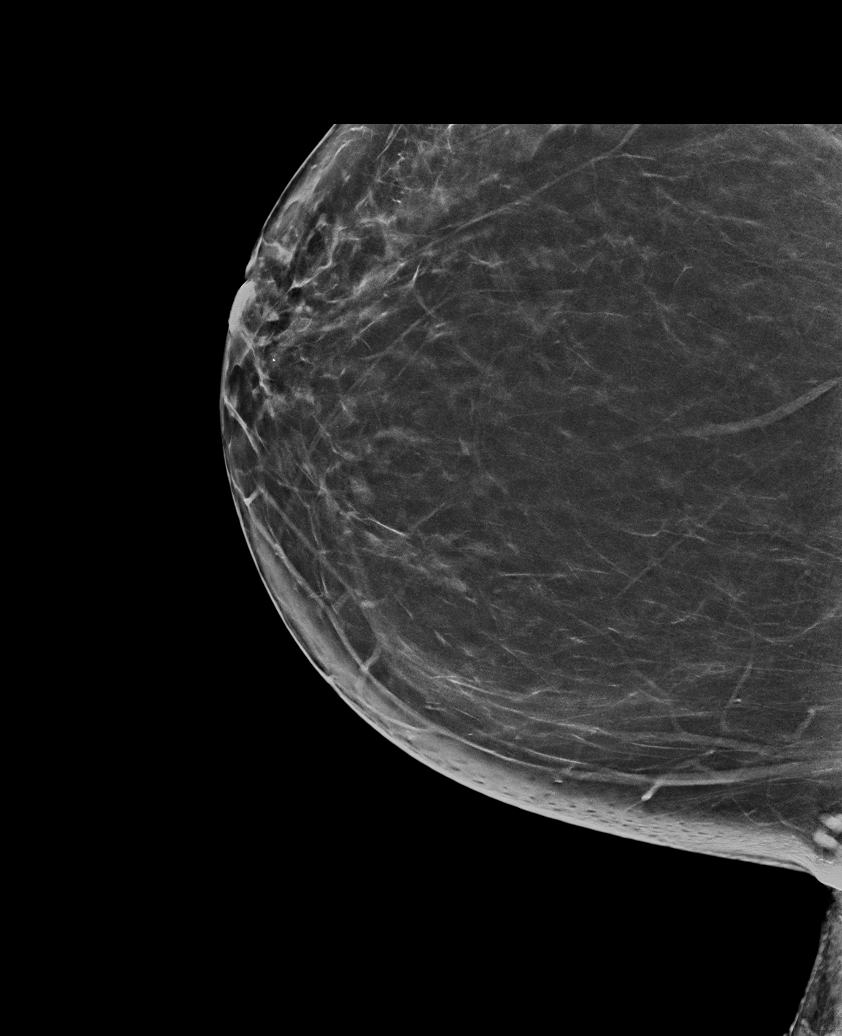

[L MLO synth-2D]
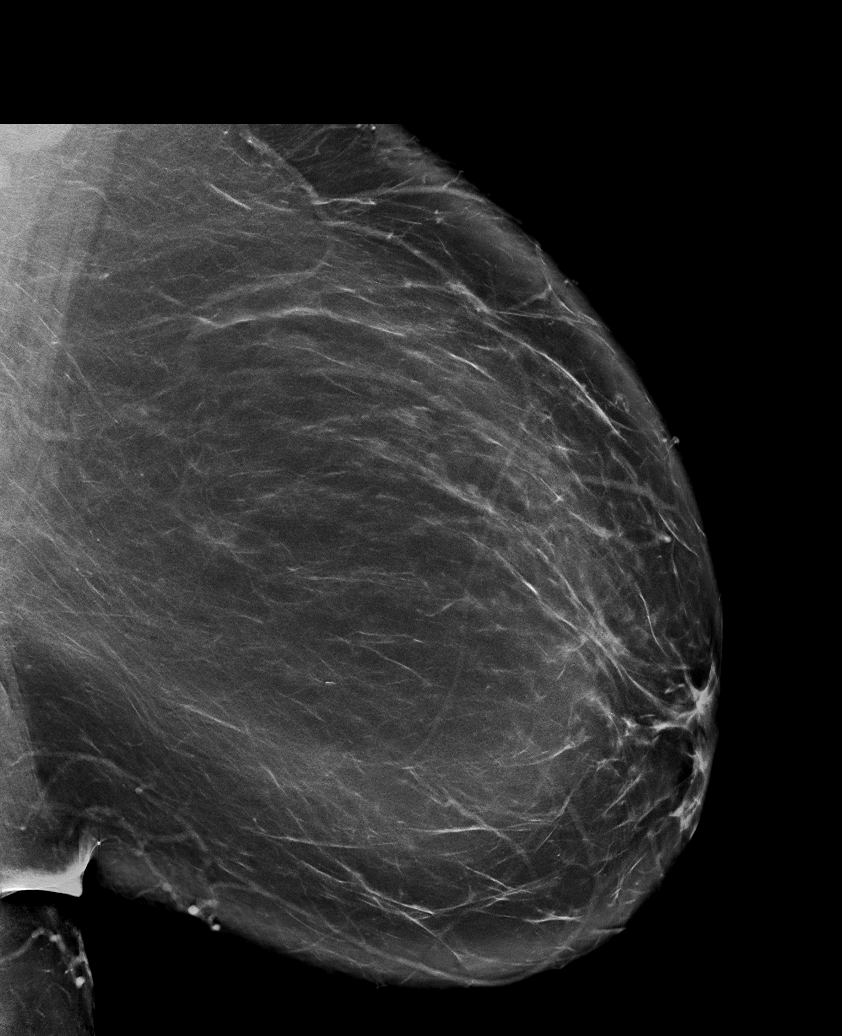

[6 of 36 positions shown; findings below may reference images not displayed]

ACR Breast Density Category b: There are scattered areas of
fibroglandular density.
FINDINGS: There are no findings suspicious for malignancy.
IMPRESSION: No mammographic evidence of malignancy. A result letter of this
screening mammogram will be mailed directly to the patient.

RECOMMENDATION:
Screening mammogram in one year. (Code:GB-Z-FIU)

BI-RADS CATEGORY  1: Negative.
